# Patient Record
Sex: Male | Born: 2000
Health system: Southern US, Community
[De-identification: ages and names within clinical notes are randomized; demographics above are authoritative.]

## PROBLEM LIST (undated history)

## (undated) DIAGNOSIS — F988 Other specified behavioral and emotional disorders with onset usually occurring in childhood and adolescence: Secondary | ICD-10-CM

## (undated) HISTORY — PX: NO PAST SURGERIES: SHX2092

---
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
## 2019-04-22 ENCOUNTER — Ambulatory Visit
Admission: EM | Admit: 2019-04-22 | Discharge: 2019-04-22 | Disposition: A | Discharge status: Home / Self Care | Payer: BC Managed Care – PPO | Attending: Emergency Medicine | Admitting: Emergency Medicine

## 2019-04-22 DIAGNOSIS — J45990 Exercise induced bronchospasm: Secondary | ICD-10-CM

## 2019-04-22 HISTORY — DX: Other specified behavioral and emotional disorders with onset usually occurring in childhood and adolescence: F98.8

## 2019-04-22 MED ORDER — ALBUTEROL SULFATE HFA 108 (90 BASE) MCG/ACT IN AERS: 2.0000 | INHALATION_SPRAY | RESPIRATORY_TRACT | 0 refills | Status: DC | PRN

## 2019-04-22 NOTE — Discharge Instructions (Signed)
Use albuterol inhaler as discussed. Keep a symptom log: When you get short of breath, what makes it better, or urine at the time. Important to follow-up with primary care for further evaluation. Go to ER for severe shortness of breath (especially if it does not improve with albuterol inhaler), chest pain, lightheadedness, syncopal event (loss of consciousness).

## 2019-04-22 NOTE — ED Provider Notes (Signed)
[facility]    CSN: [id number] Arrival date & time: 04/22/19  0808      History   Chief Complaint Chief Complaint  Patient presents with  . Shortness of Breath    HPI Christopher Koch is a 18 y.o. male without significant medical history presenting for chronic intermittent history of shortness of breath, redness and numbness over dorsal aspect of left foot.  States the symptoms do not coincide.  Has not noticed what makes the foot symptoms better/worse.  Denies low back pain, urinary or fecal incontinence/change in bowel or bladder habit.  Denies injury, pain, warmth to his foot.  Patient voices concern that he knows that there can be "neurologic damage from diabetes ".  Patient denies personal history of diabetes, states his grandfather had diabetes. Patient states that difficulty breathing occurs typically when exerting himself, though will rarely have brief episode "when I used to work at [employer], I would be doing anything ".  Denies lightheadedness, syncopal event, chest pain, wheezing during these episodes.  States that it feels like "I just cannot get a full breath ".  Patient denies history of asthma, the states his sister has asthma and is concerned he may have that.  Patient does not currently have primary care. Overall, patient denies personal or family history of connective tissue disorder, sudden/early cardiac death, autoimmune disorders.   Past Medical History:  Diagnosis Date  . ADD (attention deficit disorder)     There are no active problems to display for this patient.   History reviewed. No pertinent surgical history.     Home Medications    Prior to Admission medications   Medication Sig Start Date End Date Taking? Authorizing Provider  albuterol (VENTOLIN HFA) 108 (90 Base) MCG/ACT inhaler Inhale 2 puffs into the lungs every 4 (four) hours as needed for wheezing or shortness of breath. 04/22/19   Hall-Potvin, [place], PA-C    Family History  History reviewed. No pertinent family history.  Social History Social History   Tobacco Use  . Smoking status: Never Smoker  . Smokeless tobacco: Never Used  Substance Use Topics  . Alcohol use: No    Alcohol/week: 0.0 standard drinks  . Drug use: No     Allergies   Patient has no known allergies.   Review of Systems Review of Systems  Constitutional: Negative for activity change, appetite change, fatigue and fever.  HENT: Negative for congestion, dental problem, ear pain, facial swelling, hearing loss, sinus pain, sore throat, tinnitus, trouble swallowing and voice change.   Eyes: Negative for photophobia, pain and visual disturbance.  Respiratory: Positive for shortness of breath. Negative for apnea, cough, choking, chest tightness, wheezing and stridor.   Cardiovascular: Negative for chest pain, palpitations and leg swelling.  Gastrointestinal: Negative for abdominal pain, diarrhea, nausea and vomiting.  Endocrine: Negative for cold intolerance, heat intolerance, polydipsia, polyphagia and polyuria.  Musculoskeletal: Negative for arthralgias, back pain, gait problem, myalgias and neck pain.  Skin: Negative for rash and wound.  Neurological: Negative for dizziness, tremors, seizures, syncope, facial asymmetry, speech difficulty, weakness, light-headedness, numbness and headaches.  Hematological: Does not bruise/bleed easily.  Psychiatric/Behavioral: Negative for confusion, decreased concentration, self-injury, sleep disturbance and suicidal ideas. The patient is not nervous/anxious and is not hyperactive.   All other systems reviewed and are negative.    Physical Exam Triage Vital Signs ED Triage Vitals  Enc Vitals Group     BP      Pulse  Resp      Temp      Temp src      SpO2      Weight      Height      Head Circumference      Peak Flow      Pain Score      Pain Loc      Pain Edu?      Excl. in GC?    No data found.  Updated Vital Signs BP 117/63  (BP Location: Left Arm)   Pulse 96   Temp 98.3 F (36.8 C) (Oral)   Resp 18   SpO2 97%   Visual Acuity Right Eye Distance:   Left Eye Distance:   Bilateral Distance:    Right Eye Near:   Left Eye Near:    Bilateral Near:     Physical Exam Constitutional:      General: He is not in acute distress.    Appearance: He is well-developed and normal weight. He is not ill-appearing.  HENT:     Head: Normocephalic and atraumatic.     Mouth/Throat:     Mouth: Mucous membranes are moist.     Pharynx: Oropharynx is clear. No pharyngeal swelling or oropharyngeal exudate.  Eyes:     General: No scleral icterus.    Pupils: Pupils are equal, round, and reactive to light.  Neck:     Musculoskeletal: Neck supple.     Thyroid: No thyromegaly.     Vascular: No hepatojugular reflux or JVD.     Trachea: No tracheal deviation.  Cardiovascular:     Rate and Rhythm: Normal rate and regular rhythm.  No extrasystoles are present.    Pulses: No decreased pulses.     Heart sounds: No murmur. No friction rub. No gallop.      Comments: Heart sounds do not change with Valsalva.  PMI is nondisplaced.   Pulmonary:     Effort: Pulmonary effort is normal. No accessory muscle usage or respiratory distress.     Breath sounds: No decreased breath sounds, wheezing, rhonchi or rales.  Chest:     Chest wall: No deformity, tenderness, crepitus or edema.  Abdominal:     General: Bowel sounds are normal.     Palpations: Abdomen is soft. There is no hepatomegaly or splenomegaly.     Tenderness: There is no abdominal tenderness. There is no guarding.  Musculoskeletal: Normal range of motion.     Right lower leg: He exhibits no tenderness. No edema.     Left lower leg: He exhibits no tenderness. No edema.  Lymphadenopathy:     Cervical: No cervical adenopathy.  Skin:    General: Skin is warm.     Capillary Refill: Capillary refill takes less than 2 seconds.     Coloration: Skin is not cyanotic, jaundiced or  pale.     Comments: Feet are without lesions, dermatitis, pain, warmth bilaterally.  NVI  Neurological:     General: No focal deficit present.     Mental Status: He is alert and oriented to person, place, and time.     Comments: Upper and lower DTRs 2+ bilaterally and symmetric  Psychiatric:        Mood and Affect: Mood normal.        Behavior: Behavior normal.      UC Treatments / Results  Labs (all labs ordered are listed, but only abnormal results are displayed) Labs Reviewed - No data to display  EKG  Radiology No results found.  Procedures Procedures (including critical care time)  Medications Ordered in UC Medications - No data to display  Initial Impression / Assessment and Plan / UC Course  I have reviewed the triage vital signs and the nursing notes.  Pertinent labs & imaging results that were available during my care of the patient were reviewed by me and considered in my medical decision making (see chart for details).     1.  Exercise-induced asthma History most consistent with exercise-induced asthma.  Given sibling with asthma, reassuring physical exam today will trial albuterol inhaler for these episodes.  Reviewed proper MDI administration, provided additional patient information in visit summary.  Stressed importance of following up with primary care for further evaluation.  Return precautions discussed, patient verbalized understanding and is agreeable to plan. Final Clinical Impressions(s) / UC Diagnoses   Final diagnoses:  Exercise-induced asthma     Discharge Instructions     Use albuterol inhaler as discussed. Keep a symptom log: When you get short of breath, what makes it better, or urine at the time. Important to follow-up with primary care for further evaluation. Go to ER for severe shortness of breath (especially if it does not improve with albuterol inhaler), chest pain, lightheadedness, syncopal event (loss of consciousness).    ED  Prescriptions    Medication Sig Dispense Auth. Provider   albuterol (VENTOLIN HFA) 108 (90 Base) MCG/ACT inhaler Inhale 2 puffs into the lungs every 4 (four) hours as needed for wheezing or shortness of breath. 18 g Hall-Potvin, [place], PA-C     Controlled Substance Prescriptions [place] Controlled Substance Registry consulted? Not Applicable   Shea Evans, [place] 04/22/19 [phone]

## 2019-04-22 NOTE — ED Triage Notes (Signed)
Pt c/o SOB after working out or any exertion. Pt c/o redness and numbness to lt foot. Pt speaking in complete sentences. No distress.

## 2019-05-08 ENCOUNTER — Ambulatory Visit
Admission: EM | Admit: 2019-05-08 | Discharge: 2019-05-08 | Disposition: A | Discharge status: Home / Self Care | Payer: BC Managed Care – PPO

## 2019-05-08 DIAGNOSIS — R0609 Other forms of dyspnea: Secondary | ICD-10-CM

## 2019-05-08 NOTE — Discharge Instructions (Signed)
Try albuterol with aerochamber. Follow up with PCP as scheduled for further evaluation needed.

## 2019-05-08 NOTE — ED Provider Notes (Signed)
[facility]    CSN: [id number] Arrival date & time: 05/08/19  1404      History   Chief Complaint Chief Complaint  Patient presents with  . Shortness of Breath    HPI Christopher Koch is a 18 y.o. male.   18 year old male comes in requesting xray and blood work. States he feels short of breath with exercising for the past 11 months. He is currently asymptomatic. He was seen 04/22/2019 for similar symptoms, was provided an albuterol inhaler. States he used it once prior to exercising without relief. However, he read online that albuterol can cause seizure, so he discontinued. He denies any worsening of symptoms. Denies personal or family history of heart disease. Denies exertional chest pain, dizziness, weakness, syncope.    HPI from 04/22/2019 Christopher Koch is a 18 y.o. male without significant medical history presenting for chronic intermittent history of shortness of breath, redness and numbness over dorsal aspect of left foot.  States the symptoms do not coincide.  Has not noticed what makes the foot symptoms better/worse.  Denies low back pain, urinary or fecal incontinence/change in bowel or bladder habit.  Denies injury, pain, warmth to his foot.  Patient voices concern that he knows that there can be "neurologic damage from diabetes ".  Patient denies personal history of diabetes, states his grandfather had diabetes. Patient states that difficulty breathing occurs typically when exerting himself, though will rarely have brief episode "when I used to work at [employer], I would be doing anything ".  Denies lightheadedness, syncopal event, chest pain, wheezing during these episodes.  States that it feels like "I just cannot get a full breath ".  Patient denies history of asthma, the states his sister has asthma and is concerned he may have that.  Patient does not currently have primary care. Overall, patient denies personal or family history of connective tissue disorder,  sudden/early cardiac death, autoimmune disorders.     Past Medical History:  Diagnosis Date  . ADD (attention deficit disorder)     There are no active problems to display for this patient.   History reviewed. No pertinent surgical history.     Home Medications    Prior to Admission medications   Medication Sig Start Date End Date Taking? Authorizing Provider  albuterol (VENTOLIN HFA) 108 (90 Base) MCG/ACT inhaler Inhale 2 puffs into the lungs every 4 (four) hours as needed for wheezing or shortness of breath. 04/22/19   Hall-Potvin, [place], PA-C    Family History History reviewed. No pertinent family history.  Social History Social History   Tobacco Use  . Smoking status: Never Smoker  . Smokeless tobacco: Never Used  Substance Use Topics  . Alcohol use: No    Alcohol/week: 0.0 standard drinks  . Drug use: No     Allergies   Patient has no known allergies.   Review of Systems Review of Systems  Reason unable to perform ROS: See HPI as above.     Physical Exam Triage Vital Signs ED Triage Vitals [05/08/19 1413]  Enc Vitals Group     BP 118/78     Pulse Rate 78     Resp 16     Temp 97.6 F (36.4 C)     Temp Source Oral     SpO2 98 %     Weight      Height      Head Circumference      Peak Flow  Pain Score      Pain Loc      Pain Edu?      Excl. in GC?    No data found.  Updated Vital Signs BP 118/78 (BP Location: Left Arm)   Pulse 78   Temp 97.6 F (36.4 C) (Oral)   Resp 16   SpO2 98%   Physical Exam Constitutional:      General: He is not in acute distress.    Appearance: Normal appearance. He is not ill-appearing, toxic-appearing or diaphoretic.  HENT:     Head: Normocephalic and atraumatic.     Right Ear: Tympanic membrane, ear canal and external ear normal. Tympanic membrane is not erythematous or bulging.     Left Ear: Tympanic membrane, ear canal and external ear normal. Tympanic membrane is not erythematous or bulging.      Nose: Nose normal.     Right Sinus: No maxillary sinus tenderness or frontal sinus tenderness.     Left Sinus: No maxillary sinus tenderness or frontal sinus tenderness.     Mouth/Throat:     Mouth: Mucous membranes are moist.     Pharynx: Oropharynx is clear. Uvula midline.  Eyes:     Conjunctiva/sclera: Conjunctivae normal.     Pupils: Pupils are equal, round, and reactive to light.  Neck:     Musculoskeletal: Normal range of motion and neck supple.  Cardiovascular:     Rate and Rhythm: Normal rate and regular rhythm.     Heart sounds: Normal heart sounds. No murmur. No friction rub. No gallop.   Pulmonary:     Effort: Pulmonary effort is normal. No accessory muscle usage, respiratory distress or retractions.     Breath sounds: Normal breath sounds. No stridor, decreased air movement or transmitted upper airway sounds. No decreased breath sounds, wheezing, rhonchi or rales.  Skin:    General: Skin is warm and dry.  Neurological:     Mental Status: He is alert and oriented to person, place, and time.  Psychiatric:        Behavior: Behavior normal.        Judgment: Judgment normal.      UC Treatments / Results  Labs (all labs ordered are listed, but only abnormal results are displayed) Labs Reviewed - No data to display  EKG   Radiology No results found.  Procedures Procedures (including critical care time)  Medications Ordered in UC Medications - No data to display  Initial Impression / Assessment and Plan / UC Course  I have reviewed the triage vital signs and the nursing notes.  Pertinent labs & imaging results that were available during my care of the patient were reviewed by me and considered in my medical decision making (see chart for details).     Patient currently asymptomatic. LCTAB, speaking in full sentences. RRR, no murmurs heard. Discussed will need evaluation with PCP. Will provide aerochamber for albuterol to ensure adequate dose of albuterol.  Reassurance provided on albuterol use. PCP appointment [facility] at [facility] on 05/22/2019, patient to follow up for further evaluation and management needed. Return precautions given. Patient expresses understanding and agrees to plan.  Final Clinical Impressions(s) / UC Diagnoses   Final diagnoses:  Dyspnea on exertion   ED Prescriptions    None     PDMP not reviewed this encounter.   Belinda Fisher, PA-C 05/08/19 1441

## 2019-05-08 NOTE — ED Triage Notes (Signed)
Pt requesting x rays and blood work. States he gets SOB when he is active and his feet turns red for the past 11 months. Pt denies any sx's at this time.

## 2019-05-21 ENCOUNTER — Telehealth: Payer: Self-pay

## 2019-05-21 NOTE — Telephone Encounter (Signed)
Called patient to do their pre-visit COVID screening.  Attempted to call patient twice. Phone was answered and then hung up. Nobody said "hello" or responded to me saying "hello".

## 2019-05-21 NOTE — Progress Notes (Addendum)
Subjective:    Patient ID: Christopher Koch, male    DOB: 04-09-2001, 18 y.o.   MRN: [id number]  18 y.o.:M  Here to establish dyspnea on exertion Patient has a 1 year history of exertional dyspnea.  The patient denies specific wheezing or cough.  Shortness of breath appears to be worse.  Also the patient has complaints of the redness and numbness over the dorsal aspect of the left foot and occasionally the right foot as well.  This appears to be a separate issue.  The patient does have postnasal drainage.  Patient has no prior history of diabetes or other medical conditions.  The patient denies lightheadedness or passing out or specific chest pain.  He just states he cannot get a complete full breath.  He denies prior history of asthma.  The patient is here to establish for primary care.  The patient finished through his senior year of high school but did not graduate.  He was going to go to college but did not follow through with this.  He does play basketball every day but has difficulty completing the basketball workout.  He does note significant postnasal drainage and also reflux symptoms particularly eating certain foods such as pizza.  Please refer to asthma assessment below.   Asthma He complains of chest tightness and shortness of breath. There is no cough, difficulty breathing, frequent throat clearing, hemoptysis, hoarse voice, sputum production or wheezing. This is a chronic problem. The current episode started more than 1 year ago. Associated symptoms include chest pain, dyspnea on exertion, ear congestion, ear pain, headaches, heartburn, malaise/fatigue, nasal congestion, postnasal drip and rhinorrhea. Pertinent negatives include no orthopnea, PND, sneezing, sore throat, sweats, trouble swallowing or weight loss. His symptoms are aggravated by climbing stairs, exposure to fumes, exercise, any activity, change in weather, minimal activity and URI. His symptoms are alleviated by beta-agonist.  There is no history of asthma.    Past Medical History:  Diagnosis Date  . ADD (attention deficit disorder)      Family History  Problem Relation Age of Onset  . Heart disease Maternal Grandmother   . Diabetes Maternal Grandfather      Social History   Socioeconomic History  . Marital status: Single    Spouse name: Not on file  . Number of children: Not on file  . Years of education: Not on file  . Highest education level: Not on file  Occupational History  . Not on file  Social Needs  . Financial resource strain: Not on file  . Food insecurity    Worry: Not on file    Inability: Not on file  . Transportation needs    Medical: Not on file    Non-medical: Not on file  Tobacco Use  . Smoking status: Never Smoker  . Smokeless tobacco: Never Used  Substance and Sexual Activity  . Alcohol use: Never    Alcohol/week: 0.0 standard drinks    Frequency: Never  . Drug use: Never  . Sexual activity: Never  Lifestyle  . Physical activity    Days per week: Not on file    Minutes per session: Not on file  . Stress: Not on file  Relationships  . Social Musician on phone: Not on file    Gets together: Not on file    Attends religious service: Not on file    Active member of club or organization: Not on file    Attends meetings of  clubs or organizations: Not on file    Relationship status: Not on file  . Intimate partner violence    Fear of current or ex partner: Not on file    Emotionally abused: Not on file    Physically abused: Not on file    Forced sexual activity: Not on file  Other Topics Concern  . Not on file  Social History Narrative  . Not on file     No Known Allergies   Outpatient Medications Prior to Visit  Medication Sig Dispense Refill  . albuterol (VENTOLIN HFA) 108 (90 Base) MCG/ACT inhaler Inhale 2 puffs into the lungs every 4 (four) hours as needed for wheezing or shortness of breath. 18 g 0   No facility-administered medications  prior to visit.      Review of Systems  Constitutional: Positive for malaise/fatigue. Negative for weight loss.  HENT: Positive for ear pain, postnasal drip and rhinorrhea. Negative for hoarse voice, sneezing, sore throat and trouble swallowing.   Respiratory: Positive for shortness of breath. Negative for cough, hemoptysis, sputum production and wheezing.   Cardiovascular: Positive for chest pain and dyspnea on exertion. Negative for PND.  Gastrointestinal: Positive for heartburn.  Neurological: Positive for headaches.       Objective:   Physical Exam Vitals:   05/22/19 1434  BP: 107/65  Pulse: (!) 115  Resp: 17  Temp: 97.7 F (36.5 C)  TempSrc: Temporal  SpO2: 95%  Weight: 159 lb (72.1 kg)  Height: 6\' 1"  (1.854 m)    Gen: Pleasant, well-nourished, in no distress,  normal affect  ENT: No lesions,  mouth clear,  oropharynx clear, no postnasal drip R ear cerumen impaction, some cerumen in left ear   Neck: No JVD, no TMG, no carotid bruits  Lungs: No use of accessory muscles, no dullness to percussion, clear without rales or rhonchi  Cardiovascular: RRR, heart sounds normal, no murmur or gallops, no peripheral edema  Abdomen: soft and NT, no HSM,  BS normal  Musculoskeletal: No deformities, no cyanosis or clubbing Redness over the dorsum of the left foot and a focalized area nontender appears to be an area where the shoe is not properly fitting to the dorsum of the foot  Neuro: alert, non focal  Skin: Warm, no lesions or rashes  No results found.        Assessment & Plan:  I personally reviewed all images and lab data in the [facility] system as well as any outside material available during this office visit and agree with the  radiology impressions.   Exercise-induced asthma Symptom complex most consistent with exercise-induced asthma and associated allergic rhinitis  Plan for the patient will be to begin Singulair 10 mg daily as needed albuterol will continue  patient will also receive Flonase 2 sprays each nostril daily and Claritin 10 mg daily  We will see the patient back in follow-up in 1 month  GERD (gastroesophageal reflux disease) The patient has very mild reflux disease therefore I gave the patient a diet plan and will hold off prescribing specific medications  Foot pain, bilateral Chronic bilateral foot pain with redness over the dorsum of the left foot appears to be an area where he is likely not fitting his basketball exercise shoes properly  Nevertheless will refer to podiatry for further evaluation  Cerumen impaction R cerumen impaction with some cerumen in left ear as well  RN cleaned both ears without complication   Jullien was seen today for establish care and  asthma.  Diagnoses and all orders for this visit:  Exercise-induced asthma  Dyspnea on exertion -     DG Chest 2 View  Foot pain, bilateral -     Ambulatory referral to Podiatry  Non-seasonal allergic rhinitis, unspecified trigger  Gastroesophageal reflux disease without esophagitis  Impacted cerumen of right ear  Other orders -     montelukast (SINGULAIR) 10 MG tablet; Take 1 tablet (10 mg total) by mouth at bedtime. -     fluticasone (FLONASE) 50 MCG/ACT nasal spray; Place 2 sprays into both nostrils daily. -     cetirizine (ZYRTEC) 10 MG tablet; Take 1 tablet (10 mg total) by mouth daily.

## 2019-05-22 ENCOUNTER — Encounter: Payer: Self-pay | Admitting: Critical Care Medicine

## 2019-05-22 ENCOUNTER — Other Ambulatory Visit: Payer: Self-pay

## 2019-05-22 ENCOUNTER — Ambulatory Visit (INDEPENDENT_AMBULATORY_CARE_PROVIDER_SITE_OTHER): Payer: BC Managed Care – PPO

## 2019-05-22 ENCOUNTER — Ambulatory Visit (INDEPENDENT_AMBULATORY_CARE_PROVIDER_SITE_OTHER): Payer: BC Managed Care – PPO | Admitting: Critical Care Medicine

## 2019-05-22 VITALS — BP 107/65 | HR 115 | Temp 97.7°F | Resp 17 | Ht 73.0 in | Wt 159.0 lb

## 2019-05-22 DIAGNOSIS — R0602 Shortness of breath: Secondary | ICD-10-CM

## 2019-05-22 DIAGNOSIS — J45990 Exercise induced bronchospasm: Secondary | ICD-10-CM

## 2019-05-22 DIAGNOSIS — H6121 Impacted cerumen, right ear: Secondary | ICD-10-CM

## 2019-05-22 DIAGNOSIS — J3089 Other allergic rhinitis: Secondary | ICD-10-CM

## 2019-05-22 DIAGNOSIS — R0609 Other forms of dyspnea: Secondary | ICD-10-CM | POA: Diagnosis not present

## 2019-05-22 DIAGNOSIS — H6123 Impacted cerumen, bilateral: Secondary | ICD-10-CM | POA: Diagnosis not present

## 2019-05-22 DIAGNOSIS — M79672 Pain in left foot: Secondary | ICD-10-CM

## 2019-05-22 DIAGNOSIS — J309 Allergic rhinitis, unspecified: Secondary | ICD-10-CM | POA: Insufficient documentation

## 2019-05-22 DIAGNOSIS — M79671 Pain in right foot: Secondary | ICD-10-CM | POA: Insufficient documentation

## 2019-05-22 DIAGNOSIS — K219 Gastro-esophageal reflux disease without esophagitis: Secondary | ICD-10-CM

## 2019-05-22 MED ORDER — CETIRIZINE HCL 10 MG PO TABS: 10.0000 mg | ORAL_TABLET | Freq: Every day | ORAL | 11 refills | Status: DC

## 2019-05-22 MED ORDER — MONTELUKAST SODIUM 10 MG PO TABS: 10.0000 mg | ORAL_TABLET | Freq: Every day | ORAL | 3 refills | Status: DC

## 2019-05-22 MED ORDER — FLUTICASONE PROPIONATE 50 MCG/ACT NA SUSP: 2.0000 | Freq: Every day | NASAL | 6 refills | Status: DC

## 2019-05-22 NOTE — Assessment & Plan Note (Signed)
Chronic bilateral foot pain with redness over the dorsum of the left foot appears to be an area where he is likely not fitting his basketball exercise shoes properly  Nevertheless will refer to podiatry for further evaluation

## 2019-05-22 NOTE — Assessment & Plan Note (Signed)
Symptom complex most consistent with exercise-induced asthma and associated allergic rhinitis  Plan for the patient will be to begin Singulair 10 mg daily as needed albuterol will continue patient will also receive Flonase 2 sprays each nostril daily and Claritin 10 mg daily  We will see the patient back in follow-up in 1 month

## 2019-05-22 NOTE — Assessment & Plan Note (Signed)
The patient has very mild reflux disease therefore I gave the patient a diet plan and will hold off prescribing specific medications

## 2019-05-22 NOTE — Patient Instructions (Signed)
Start Singulair take 1 at bedtime  Start Flonase 2 sprays each nostril daily and loratadine 10 mg daily  Use your albuterol inhaler 2 inhalations before any exercise that she would plan to do otherwise take it as needed every 6 hours for shortness of breath  Follow a reflux diet as outlined below  Return to see Dr. Joya Gaskins 1 month   Food Choices for Gastroesophageal Reflux Disease, Adult When you have gastroesophageal reflux disease (GERD), the foods you eat and your eating habits are very important. Choosing the right foods can help ease your discomfort. Think about working with a nutrition specialist (dietitian) to help you make good choices. What are tips for following this plan?  Meals  Choose healthy foods that are low in fat, such as fruits, vegetables, whole grains, low-fat dairy products, and lean meat, fish, and poultry.  Eat small meals often instead of 3 large meals a day. Eat your meals slowly, and in a place where you are relaxed. Avoid bending over or lying down until 2-3 hours after eating.  Avoid eating meals 2-3 hours before bed.  Avoid drinking a lot of liquid with meals.  Cook foods using methods other than frying. Bake, grill, or broil food instead.  Avoid or limit: ? Chocolate. ? Peppermint or spearmint. ? Alcohol. ? Pepper. ? Black and decaffeinated coffee. ? Black and decaffeinated tea. ? Bubbly (carbonated) soft drinks. ? Caffeinated energy drinks and soft drinks.  Limit high-fat foods such as: ? Fatty meat or fried foods. ? Whole milk, cream, butter, or ice cream. ? Nuts and nut butters. ? Pastries, donuts, and sweets made with butter or shortening.  Avoid foods that cause symptoms. These foods may be different for everyone. Common foods that cause symptoms include: ? Tomatoes. ? Oranges, lemons, and limes. ? Peppers. ? Spicy food. ? Onions and garlic. ? Vinegar. Lifestyle  Maintain a healthy weight. Ask your doctor what weight is healthy  for you. If you need to lose weight, work with your doctor to do so safely.  Exercise for at least 30 minutes for 5 or more days each week, or as told by your doctor.  Wear loose-fitting clothes.  Do not smoke. If you need help quitting, ask your doctor.  Sleep with the head of your bed higher than your feet. Use a wedge under the mattress or blocks under the bed frame to raise the head of the bed. Summary  When you have gastroesophageal reflux disease (GERD), food and lifestyle choices are very important in easing your symptoms.  Eat small meals often instead of 3 large meals a day. Eat your meals slowly, and in a place where you are relaxed.  Limit high-fat foods such as fatty meat or fried foods.  Avoid bending over or lying down until 2-3 hours after eating.  Avoid peppermint and spearmint, caffeine, alcohol, and chocolate. This information is not intended to replace advice given to you by your health care provider. Make sure you discuss any questions you have with your health care provider. Document Released: 01/30/2012 Document Revised: 11/21/2018 Document Reviewed: 09/05/2016 Elsevier Patient Education  2020 [employer].

## 2019-05-25 DIAGNOSIS — H6120 Impacted cerumen, unspecified ear: Secondary | ICD-10-CM | POA: Insufficient documentation

## 2019-05-25 NOTE — Assessment & Plan Note (Signed)
R cerumen impaction with some cerumen in left ear as well  RN cleaned both ears without complication

## 2019-05-26 ENCOUNTER — Encounter: Payer: BLUE CROSS/BLUE SHIELD | Admitting: Podiatry

## 2019-06-26 ENCOUNTER — Ambulatory Visit: Payer: BC Managed Care – PPO

## 2019-09-12 ENCOUNTER — Ambulatory Visit
Admission: EM | Admit: 2019-09-12 | Discharge: 2019-09-12 | Disposition: A | Discharge status: Home / Self Care | Payer: Commercial Managed Care - PPO | Attending: Emergency Medicine | Admitting: Emergency Medicine

## 2019-09-12 DIAGNOSIS — Z20822 Contact with and (suspected) exposure to covid-19: Secondary | ICD-10-CM | POA: Diagnosis not present

## 2019-09-12 DIAGNOSIS — J029 Acute pharyngitis, unspecified: Secondary | ICD-10-CM | POA: Diagnosis present

## 2019-09-12 LAB — POCT RAPID STREP A (OFFICE): Rapid Strep A Screen: NEGATIVE

## 2019-09-12 NOTE — Discharge Instructions (Addendum)
Your rapid strep test was negative today.  The culture is pending.  Please look on your MyChart for test results.   We will notify you if the culture positive and outline a treatment plan at that time.   Please continue Tylenol and/or Ibuprofen as needed for fever, pain.  May try warm salt water gargles, cepacol lozenges, throat spray, warm tea or water with lemon/honey, or OTC cold relief medicine for throat discomfort.  May gargle, spit viscous lidocaine as prescribed for additional relief.  (Please note this may cause the back of your tongue and mouth to be numb as well)  For congestion: take a daily anti-histamine like Zyrtec, Claritin, and a oral decongestant to help with post nasal drip that may be irritating your throat.   It is important to stay hydrated: drink plenty of fluids (primarily water) to keep your throat moisturized and help further relieve irritation/discomfort.  

## 2019-09-12 NOTE — ED Provider Notes (Signed)
[facility]    CSN: [id number] Arrival date & time: 09/12/19  1151      History   Chief Complaint Chief Complaint  Patient presents with  . Sore Throat    HPI Christopher Koch is a 19 y.o. male present for sore throat, congestion, fatigue and headache for the last week.  Headache is generalized without alarm symptoms such as change in vision, worst headache of life, thunderclap headache, tinnitus, fever.  Denies difficulty breathing or swallowing, sinus pain or pressure, dental pain, cough, shortness of breath.  No known Covid exposure.  Has not take anything for his symptoms.   Past Medical History:  Diagnosis Date  . ADD (attention deficit disorder)     Patient Active Problem List   Diagnosis Date Noted  . Cerumen impaction 05/25/2019  . Exercise-induced asthma 05/22/2019  . Foot pain, bilateral 05/22/2019  . Allergic rhinitis 05/22/2019  . GERD (gastroesophageal reflux disease) 05/22/2019    Past Surgical History:  Procedure Laterality Date  . NO PAST SURGERIES         Home Medications    Prior to Admission medications   Not on File    Family History Family History  Problem Relation Age of Onset  . Heart disease Maternal Grandmother   . Diabetes Maternal Grandfather     Social History Social History   Tobacco Use  . Smoking status: Never Smoker  . Smokeless tobacco: Never Used  Substance Use Topics  . Alcohol use: Never    Alcohol/week: 0.0 standard drinks  . Drug use: Never     Allergies   Patient has no known allergies.   Review of Systems As per HPI   Physical Exam Triage Vital Signs ED Triage Vitals  Enc Vitals Group     BP 09/12/19 1203 106/74     Pulse Rate 09/12/19 1203 67     Resp 09/12/19 1203 16     Temp 09/12/19 1203 (!) 97.5 F (36.4 C)     Temp Source 09/12/19 1203 Oral     SpO2 09/12/19 1203 99 %     Weight --      Height --      Head Circumference --      Peak Flow --      Pain Score 09/12/19  1205 6     Pain Loc --      Pain Edu? --      Excl. in [place]? --    No data found.  Updated Vital Signs BP 106/74 (BP Location: Left Arm)   Pulse 67   Temp (!) 97.5 F (36.4 C) (Oral)   Resp 16   SpO2 99%   Visual Acuity Right Eye Distance:   Left Eye Distance:   Bilateral Distance:    Right Eye Near:   Left Eye Near:    Bilateral Near:     Physical Exam Constitutional:      General: He is not in acute distress. HENT:     Head: Normocephalic and atraumatic.     Right Ear: Tympanic membrane, ear canal and external ear normal.     Left Ear: Tympanic membrane, ear canal and external ear normal.     Nose: No nasal deformity, congestion or rhinorrhea.     Comments: Turbinates nonedematous bilaterally with pink mucosa    Mouth/Throat:     Mouth: Mucous membranes are moist.     Tongue: Tongue does not deviate from midline.     Pharynx: Oropharynx  is clear. Uvula midline.     Comments: No tonsillar hypertrophy or exudate Eyes:     General: No scleral icterus.    Conjunctiva/sclera: Conjunctivae normal.     Pupils: Pupils are equal, round, and reactive to light.  Cardiovascular:     Rate and Rhythm: Normal rate.  Pulmonary:     Effort: Pulmonary effort is normal. No respiratory distress.     Breath sounds: No wheezing.  Musculoskeletal:     Cervical back: Normal range of motion and neck supple. No muscular tenderness.  Lymphadenopathy:     Cervical: No cervical adenopathy.  Neurological:     Mental Status: He is alert.      UC Treatments / Results  Labs (all labs ordered are listed, but only abnormal results are displayed) Labs Reviewed  POCT RAPID STREP A (OFFICE) - Normal  NOVEL CORONAVIRUS, NAA  CULTURE, GROUP A STREP [facility])    EKG   Radiology No results found.  Procedures Procedures (including critical care time)  Medications Ordered in UC Medications - No data to display  Initial Impression / Assessment and Plan / UC Course  I have reviewed the  triage vital signs and the nursing notes.  Pertinent labs & imaging results that were available during my care of the patient were reviewed by me and considered in my medical decision making (see chart for details).     Patient afebrile, nontoxic, with SpO2 99%.  Rapid strep negative-culture pending.  Covid PCR pending.  Patient to quarantine until results are back.  We will continue supportive management.  Return precautions discussed, patient verbalized understanding and is agreeable to plan. Final Clinical Impressions(s) / UC Diagnoses   Final diagnoses:  Sore throat     Discharge Instructions     Your rapid strep test was negative today.  The culture is pending.  Please look on your MyChart for test results.   We will notify you if the culture positive and outline a treatment plan at that time.   Please continue Tylenol and/or Ibuprofen as needed for fever, pain.  May try warm salt water gargles, cepacol lozenges, throat spray, warm tea or water with lemon/honey, or OTC cold relief medicine for throat discomfort.  May gargle, spit viscous lidocaine as prescribed for additional relief.  (Please note this may cause the back of your tongue and mouth to be numb as well)  For congestion: take a daily anti-histamine like Zyrtec, Claritin, and a oral decongestant to help with post nasal drip that may be irritating your throat.   It is important to stay hydrated: drink plenty of fluids (primarily water) to keep your throat moisturized and help further relieve irritation/discomfort.     ED Prescriptions    None     PDMP not reviewed this encounter.   Hall-Potvin, [place], [place] 09/12/19 1535

## 2019-09-12 NOTE — ED Triage Notes (Signed)
Pt c/o sore throat, congestion, fatigue and headache x1wk

## 2019-09-13 LAB — NOVEL CORONAVIRUS, NAA: SARS-CoV-2, NAA: NOT DETECTED

## 2019-09-14 LAB — CULTURE, GROUP A STREP (THRC)

## 2020-02-19 ENCOUNTER — Ambulatory Visit
Admission: EM | Admit: 2020-02-19 | Discharge: 2020-02-19 | Disposition: A | Discharge status: Home / Self Care | Payer: Commercial Managed Care - PPO | Attending: Emergency Medicine | Admitting: Emergency Medicine

## 2020-02-19 DIAGNOSIS — M25511 Pain in right shoulder: Secondary | ICD-10-CM

## 2020-02-19 DIAGNOSIS — M7918 Myalgia, other site: Secondary | ICD-10-CM

## 2020-02-19 MED ORDER — CYCLOBENZAPRINE HCL 5 MG PO TABS: 5.0000 mg | ORAL_TABLET | Freq: Every evening | ORAL | 0 refills | Status: DC | PRN

## 2020-02-19 MED ORDER — NAPROXEN 500 MG PO TABS: 500.0000 mg | ORAL_TABLET | Freq: Two times a day (BID) | ORAL | 0 refills | Status: DC

## 2020-02-19 NOTE — Discharge Instructions (Signed)
Recommend RICE: rest, ice, compression, elevation as needed for pain.    Heat therapy (hot compress, warm wash rag, hot showers, etc.) can help relax muscles and soothe muscle aches. Cold therapy (ice packs) can be used to help swelling both after injury and after prolonged use of areas of chronic pain/aches.  For pain: recommend 350 mg-1000 mg of Tylenol (acetaminophen) and/or 200 mg - 800 mg of Advil (ibuprofen, Motrin) every 8 hours as needed.  May alternate between the two throughout the day as they are generally safe to take together.  DO NOT exceed more than 3000 mg of Tylenol or 3200 mg of ibuprofen in a 24 hour period as this could damage your stomach, kidneys, liver, or increase your bleeding risk.  May take muscle relaxer as needed for severe pain / spasm.  (This medication may cause you to become tired so it is important you do not drink alcohol or operate heavy machinery while on this medication.  Recommend your first dose to be taken before bedtime to monitor for side effects safely)  Important to follow up with sports medicine.

## 2020-02-19 NOTE — ED Triage Notes (Signed)
Pt c/o rt upper back/shoulder pain for over a few years. States he works out every day and always have pain.

## 2020-02-19 NOTE — ED Provider Notes (Signed)
[facility]    CSN: [id number] Arrival date & time: 02/19/20  1111      History   Chief Complaint Chief Complaint  Patient presents with  . Back Pain    HPI Christopher Koch is a 19 y.o. male with history of ADD presenting for right upper back and shoulder pain.  States this has been intermittent for the last few years, though has been worse for the last few days.  No neck pain, upper extremity numbness or weakness, cough, chest pain or difficulty breathing.  No rash.  Has tried ibuprofen with some relief.  Denies trauma, fall.  States he missed work due to this.  Denies heavy lifting there.  Past Medical History:  Diagnosis Date  . ADD (attention deficit disorder)     Patient Active Problem List   Diagnosis Date Noted  . Cerumen impaction 05/25/2019  . Exercise-induced asthma 05/22/2019  . Foot pain, bilateral 05/22/2019  . Allergic rhinitis 05/22/2019  . GERD (gastroesophageal reflux disease) 05/22/2019    Past Surgical History:  Procedure Laterality Date  . NO PAST SURGERIES         Home Medications    Prior to Admission medications   Medication Sig Start Date End Date Taking? Authorizing Provider  cyclobenzaprine (FLEXERIL) 5 MG tablet Take 1 tablet (5 mg total) by mouth at bedtime as needed for muscle spasms. 02/19/20   Hall-Potvin, [place], PA-C  naproxen (NAPROSYN) 500 MG tablet Take 1 tablet (500 mg total) by mouth 2 (two) times daily. 02/19/20   Hall-Potvin, [place], PA-C    Family History Family History  Problem Relation Age of Onset  . Heart disease Maternal Grandmother   . Diabetes Maternal Grandfather     Social History Social History   Tobacco Use  . Smoking status: Never Smoker  . Smokeless tobacco: Never Used  Substance Use Topics  . Alcohol use: Never    Alcohol/week: 0.0 standard drinks  . Drug use: Never     Allergies   Patient has no known allergies.   Review of Systems As per HPI   Physical Exam Triage Vital  Signs ED Triage Vitals  Enc Vitals Group     BP      Pulse      Resp      Temp      Temp src      SpO2      Weight      Height      Head Circumference      Peak Flow      Pain Score      Pain Loc      Pain Edu?      Excl. in [place]?    No data found.  Updated Vital Signs BP (!) 108/57 (BP Location: Left Arm)   Pulse (!) 56   Temp 97.6 F (36.4 C) (Oral)   Resp 16   SpO2 98%   Visual Acuity Right Eye Distance:   Left Eye Distance:   Bilateral Distance:    Right Eye Near:   Left Eye Near:    Bilateral Near:     Physical Exam Constitutional:      General: He is not in acute distress. HENT:     Head: Normocephalic and atraumatic.  Eyes:     General: No scleral icterus.    Pupils: Pupils are equal, round, and reactive to light.  Cardiovascular:     Rate and Rhythm: Normal rate.  Pulmonary:  Effort: Pulmonary effort is normal. No respiratory distress.     Breath sounds: No wheezing.  Musculoskeletal:        General: Swelling and tenderness present. No deformity. Normal range of motion.     Comments: Right rhomboid edematous, tender.  No crepitus.  No bony deformity or tenderness of scapula, shoulder joint.  NVI  Skin:    Coloration: Skin is not jaundiced or pale.  Neurological:     Mental Status: He is alert and oriented to person, place, and time.      UC Treatments / Results  Labs (all labs ordered are listed, but only abnormal results are displayed) Labs Reviewed - No data to display  EKG   Radiology No results found.  Procedures Procedures (including critical care time)  Medications Ordered in UC Medications - No data to display  Initial Impression / Assessment and Plan / UC Course  I have reviewed the triage vital signs and the nursing notes.  Pertinent labs & imaging results that were available during my care of the patient were reviewed by me and considered in my medical decision making (see chart for details).     No injury or bony  deformity/tenderness: Radiography deferred.  Upper extremities neurovascularly intact and C-spine is without compromise.  Will treat supportively as outlined below, have patient follow-up with sports medicine given chronicity of issue.  Return precautions discussed, patient verbalized understanding and is agreeable to plan. Final Clinical Impressions(s) / UC Diagnoses   Final diagnoses:  Acute pain of right shoulder  Pain of rhomboid muscle     Discharge Instructions     Recommend RICE: rest, ice, compression, elevation as needed for pain.    Heat therapy (hot compress, warm wash rag, hot showers, etc.) can help relax muscles and soothe muscle aches. Cold therapy (ice packs) can be used to help swelling both after injury and after prolonged use of areas of chronic pain/aches.  For pain: recommend 350 mg-1000 mg of Tylenol (acetaminophen) and/or 200 mg - 800 mg of Advil (ibuprofen, Motrin) every 8 hours as needed.  May alternate between the two throughout the day as they are generally safe to take together.  DO NOT exceed more than 3000 mg of Tylenol or 3200 mg of ibuprofen in a 24 hour period as this could damage your stomach, kidneys, liver, or increase your bleeding risk.  May take muscle relaxer as needed for severe pain / spasm.  (This medication may cause you to become tired so it is important you do not drink alcohol or operate heavy machinery while on this medication.  Recommend your first dose to be taken before bedtime to monitor for side effects safely)  Important to follow up with sports medicine.    ED Prescriptions    Medication Sig Dispense Auth. Provider   cyclobenzaprine (FLEXERIL) 5 MG tablet Take 1 tablet (5 mg total) by mouth at bedtime as needed for muscle spasms. 10 tablet Hall-Potvin, [place], PA-C   naproxen (NAPROSYN) 500 MG tablet Take 1 tablet (500 mg total) by mouth 2 (two) times daily. 30 tablet Hall-Potvin, [place], PA-C     I have reviewed the PDMP during  this encounter.   Hall-Potvin, [place], [place] 02/19/20 1240

## 2020-04-25 ENCOUNTER — Other Ambulatory Visit: Payer: Self-pay

## 2020-04-25 ENCOUNTER — Encounter: Payer: Self-pay | Admitting: Emergency Medicine

## 2020-04-25 ENCOUNTER — Ambulatory Visit
Admission: EM | Admit: 2020-04-25 | Discharge: 2020-04-25 | Disposition: A | Discharge status: Home / Self Care | Payer: Commercial Managed Care - PPO | Attending: Emergency Medicine | Admitting: Emergency Medicine

## 2020-04-25 DIAGNOSIS — J029 Acute pharyngitis, unspecified: Secondary | ICD-10-CM | POA: Diagnosis present

## 2020-04-25 DIAGNOSIS — Z1152 Encounter for screening for COVID-19: Secondary | ICD-10-CM | POA: Insufficient documentation

## 2020-04-25 LAB — POCT RAPID STREP A (OFFICE): Rapid Strep A Screen: NEGATIVE

## 2020-04-25 MED ORDER — AMOXICILLIN 500 MG PO CAPS: 500.0000 mg | ORAL_CAPSULE | Freq: Two times a day (BID) | ORAL | 0 refills | Status: AC

## 2020-04-25 NOTE — Discharge Instructions (Signed)

## 2020-04-25 NOTE — ED Provider Notes (Signed)
[facility]    CSN: [id number] Arrival date & time: 04/25/20  0805      History   Chief Complaint Chief Complaint  Patient presents with  . Sore Throat    HPI Christopher Koch is a 19 y.o. male  Presenting for sore throat and left ear pain x1 week.  Patient endorses history of tonsillitis: States it feels similar.  Denies fever, cough, difficulty breathing.  Does endorse odynophagia.  No chest pain or palpitations, vomiting.  Has tried Tylenol, ibuprofen without relief.  No known sick contacts.  Past Medical History:  Diagnosis Date  . ADD (attention deficit disorder)     Patient Active Problem List   Diagnosis Date Noted  . Cerumen impaction 05/25/2019  . Exercise-induced asthma 05/22/2019  . Foot pain, bilateral 05/22/2019  . Allergic rhinitis 05/22/2019  . GERD (gastroesophageal reflux disease) 05/22/2019    Past Surgical History:  Procedure Laterality Date  . NO PAST SURGERIES         Home Medications    Prior to Admission medications   Medication Sig Start Date End Date Taking? Authorizing Provider  amoxicillin (AMOXIL) 500 MG capsule Take 1 capsule (500 mg total) by mouth 2 (two) times daily for 7 days. 04/25/20 05/02/20  Hall-Potvin, [place], PA-C    Family History Family History  Problem Relation Age of Onset  . Heart disease Maternal Grandmother   . Diabetes Maternal Grandfather     Social History Social History   Tobacco Use  . Smoking status: Never Smoker  . Smokeless tobacco: Never Used  Substance Use Topics  . Alcohol use: Never    Alcohol/week: 0.0 standard drinks  . Drug use: Never     Allergies   Patient has no known allergies.   Review of Systems As per HPI   Physical Exam Triage Vital Signs ED Triage Vitals  Enc Vitals Group     BP 04/25/20 0819 99/63     Pulse Rate 04/25/20 0819 74     Resp 04/25/20 0819 18     Temp 04/25/20 0819 98.8 F (37.1 C)     Temp Source 04/25/20 0819 Oral     SpO2 04/25/20  0819 98 %     Weight --      Height --      Head Circumference --      Peak Flow --      Pain Score 04/25/20 0823 8     Pain Loc --      Pain Edu? --      Excl. in [place]? --    No data found.  Updated Vital Signs BP 99/63 (BP Location: Left Arm)   Pulse 74   Temp 98.8 F (37.1 C) (Oral)   Resp 18   SpO2 98%   Visual Acuity Right Eye Distance:   Left Eye Distance:   Bilateral Distance:    Right Eye Near:   Left Eye Near:    Bilateral Near:     Physical Exam Constitutional:      General: He is not in acute distress.    Appearance: He is well-developed. He is ill-appearing. He is not toxic-appearing or diaphoretic.  HENT:     Head: Normocephalic and atraumatic.     Right Ear: Tympanic membrane and ear canal normal.     Left Ear: Tympanic membrane and ear canal normal.     Ears:     Comments: Right ear with tenderness on otoscopic exam  Mouth/Throat:     Mouth: Mucous membranes are moist.     Pharynx: Oropharynx is clear. Uvula midline. Posterior oropharyngeal erythema present. No uvula swelling.     Tonsils: Tonsillar exudate present. 0 on the right. 2+ on the left.  Eyes:     General: No scleral icterus.    Conjunctiva/sclera: Conjunctivae normal.     Pupils: Pupils are equal, round, and reactive to light.  Neck:     Comments: Trachea midline, negative JVD Cardiovascular:     Rate and Rhythm: Normal rate and regular rhythm.  Pulmonary:     Effort: Pulmonary effort is normal. No respiratory distress.     Breath sounds: No wheezing.  Musculoskeletal:     Cervical back: Neck supple. No tenderness.  Lymphadenopathy:     Cervical: No cervical adenopathy.  Skin:    Capillary Refill: Capillary refill takes less than 2 seconds.     Coloration: Skin is not jaundiced or pale.     Findings: No rash.  Neurological:     Mental Status: He is alert and oriented to person, place, and time.      UC Treatments / Results  Labs (all labs ordered are listed, but only  abnormal results are displayed) Labs Reviewed  NOVEL CORONAVIRUS, NAA  CULTURE, GROUP A STREP [facility])  POCT RAPID STREP A (OFFICE)    EKG   Radiology No results found.  Procedures Procedures (including critical care time)  Medications Ordered in UC Medications - No data to display  Initial Impression / Assessment and Plan / UC Course  I have reviewed the triage vital signs and the nursing notes.  Pertinent labs & imaging results that were available during my care of the patient were reviewed by me and considered in my medical decision making (see chart for details).     Patient afebrile, nontoxic, with SpO2 98%.  Rapid strep negative, culture pending.  Covid PCR pending.  Patient to quarantine until results are back.  We will cover for acute tonsillitis given unilaterality of hypertrophy and exudate.  Return precautions discussed, patient verbalized understanding and is agreeable to plan. Final Clinical Impressions(s) / UC Diagnoses   Final diagnoses:  Encounter for screening for COVID-19  Sore throat     Discharge Instructions     Your rapid strep test was negative today.  The culture is pending.  Please look on your MyChart for test results.   We will notify you if the culture positive and outline a treatment plan at that time.   Please continue Tylenol and/or Ibuprofen as needed for fever, pain.  May try warm salt water gargles, cepacol lozenges, throat spray, warm tea or water with lemon/honey, or OTC cold relief medicine for throat discomfort.    For congestion: take a daily anti-histamine like Zyrtec, Claritin, and a oral decongestant to help with post nasal drip that may be irritating your throat.   It is important to stay hydrated: drink plenty of fluids (primarily water) to keep your throat moisturized and help further relieve irritation/discomfort.     ED Prescriptions    Medication Sig Dispense Auth. Provider   amoxicillin (AMOXIL) 500 MG capsule Take 1  capsule (500 mg total) by mouth 2 (two) times daily for 7 days. 14 capsule Hall-Potvin, [place], PA-C     PDMP not reviewed this encounter.   Hall-Potvin, [place], [place] 04/25/20 [phone]

## 2020-04-25 NOTE — ED Triage Notes (Signed)
Pt here for sore throat x 1 week and left ear pain

## 2020-04-26 LAB — SARS-COV-2, NAA 2 DAY TAT

## 2020-04-26 LAB — NOVEL CORONAVIRUS, NAA: SARS-CoV-2, NAA: NOT DETECTED

## 2020-04-29 LAB — CULTURE, GROUP A STREP (THRC)

## 2020-05-04 ENCOUNTER — Encounter: Payer: Self-pay | Admitting: *Deleted

## 2020-05-04 ENCOUNTER — Ambulatory Visit
Admission: EM | Admit: 2020-05-04 | Discharge: 2020-05-04 | Disposition: A | Discharge status: Home / Self Care | Payer: Commercial Managed Care - PPO | Attending: Emergency Medicine | Admitting: Emergency Medicine

## 2020-05-04 ENCOUNTER — Other Ambulatory Visit: Payer: Self-pay

## 2020-05-04 DIAGNOSIS — Z1152 Encounter for screening for COVID-19: Secondary | ICD-10-CM

## 2020-05-04 DIAGNOSIS — J0391 Acute recurrent tonsillitis, unspecified: Secondary | ICD-10-CM

## 2020-05-04 MED ORDER — AMOXICILLIN-POT CLAVULANATE 875-125 MG PO TABS: 1.0000 | ORAL_TABLET | Freq: Two times a day (BID) | ORAL | 0 refills | Status: DC

## 2020-05-04 NOTE — Discharge Instructions (Signed)
Antibiotic 2 times a day.

## 2020-05-04 NOTE — ED Triage Notes (Addendum)
Patient states that his brother was notified that he was positive for COVID on today. Patient stays in the home with brother.  Patient states he has tonsillitis and severe tonsil stones. Patient states that he has been experiencing symptoms for 2-3 weeks. Patient states he finished Amoxicillin prescription about a week ago for tonsillitis.

## 2020-05-04 NOTE — ED Provider Notes (Signed)
[facility]    CSN: [id number] Arrival date & time: 05/04/20  1529      History   Chief Complaint Chief Complaint  Patient presents with  . Chills  . Fever  . Nausea    HPI Christopher Koch is a 19 y.o. male  Presenting for recurrent tonsillitis.  Patient has a history thereof.  Went to see ENT, though they recommended Covid testing prior to being evaluated.  Endorsing odynophagia, chills, subjective fever.  No chest pain, palpitations, difficulty breathing.  Past Medical History:  Diagnosis Date  . ADD (attention deficit disorder)     Patient Active Problem List   Diagnosis Date Noted  . Cerumen impaction 05/25/2019  . Exercise-induced asthma 05/22/2019  . Foot pain, bilateral 05/22/2019  . Allergic rhinitis 05/22/2019  . GERD (gastroesophageal reflux disease) 05/22/2019    Past Surgical History:  Procedure Laterality Date  . NO PAST SURGERIES         Home Medications    Prior to Admission medications   Medication Sig Start Date End Date Taking? Authorizing Provider  amoxicillin-clavulanate (AUGMENTIN) 875-125 MG tablet Take 1 tablet by mouth every 12 (twelve) hours. 05/04/20   Hall-Potvin, [place], PA-C    Family History Family History  Problem Relation Age of Onset  . Heart disease Maternal Grandmother   . Diabetes Maternal Grandfather     Social History Social History   Tobacco Use  . Smoking status: Never Smoker  . Smokeless tobacco: Never Used  Vaping Use  . Vaping Use: Never used  Substance Use Topics  . Alcohol use: Never    Alcohol/week: 0.0 standard drinks  . Drug use: Never     Allergies   Patient has no known allergies.   Review of Systems As per HPI   Physical Exam Triage Vital Signs ED Triage Vitals  Enc Vitals Group     BP 05/04/20 1548 105/66     Pulse Rate 05/04/20 1548 96     Resp 05/04/20 1548 16     Temp 05/04/20 1548 99 F (37.2 C)     Temp Source 05/04/20 1548 Oral     SpO2 05/04/20 1548 98  %     Weight --      Height --      Head Circumference --      Peak Flow --      Pain Score 05/04/20 1544 10     Pain Loc --      Pain Edu? --      Excl. in GC? --    No data found.  Updated Vital Signs BP 105/66 (BP Location: Left Arm)   Pulse 96   Temp 99 F (37.2 C) (Oral)   Resp 16   SpO2 98%   Visual Acuity Right Eye Distance:   Left Eye Distance:   Bilateral Distance:    Right Eye Near:   Left Eye Near:    Bilateral Near:     Physical Exam Constitutional:      General: He is not in acute distress. HENT:     Head: Normocephalic and atraumatic.     Mouth/Throat:     Lips: Pink.     Mouth: Mucous membranes are moist.     Tonsils: 2+ on the right. 2+ on the left.  Eyes:     General: No scleral icterus.    Pupils: Pupils are equal, round, and reactive to light.  Cardiovascular:     Rate and Rhythm: Normal  rate.  Pulmonary:     Effort: Pulmonary effort is normal. No respiratory distress.     Breath sounds: No wheezing.  Musculoskeletal:     Cervical back: Tenderness present.  Lymphadenopathy:     Cervical: Cervical adenopathy present.  Skin:    Coloration: Skin is not jaundiced or pale.  Neurological:     Mental Status: He is alert and oriented to person, place, and time.      UC Treatments / Results  Labs (all labs ordered are listed, but only abnormal results are displayed) Labs Reviewed  NOVEL CORONAVIRUS, NAA    EKG   Radiology No results found.  Procedures Procedures (including critical care time)  Medications Ordered in UC Medications - No data to display  Initial Impression / Assessment and Plan / UC Course  I have reviewed the triage vital signs and the nursing notes.  Pertinent labs & imaging results that were available during my care of the patient were reviewed by me and considered in my medical decision making (see chart for details).     Patient afebrile, nontoxic, with SpO2 98%.  Declined strep testing.  Covid PCR  pending.  Patient to quarantine until results are back.  We will treat for recurrent tonsillitis with Augmentin as outlined below.  Stressed importance of ENT follow-up.  Return precautions discussed, patient verbalized understanding and is agreeable to plan. Final Clinical Impressions(s) / UC Diagnoses   Final diagnoses:  Recurrent tonsillitis     Discharge Instructions     Antibiotic 2 times a day.    ED Prescriptions    Medication Sig Dispense Auth. Provider   amoxicillin-clavulanate (AUGMENTIN) 875-125 MG tablet Take 1 tablet by mouth every 12 (twelve) hours. 14 tablet Hall-Potvin, [place], PA-C     PDMP not reviewed this encounter.   Hall-Potvin, [place], [place] 05/04/20 1713

## 2020-05-06 LAB — NOVEL CORONAVIRUS, NAA: SARS-CoV-2, NAA: DETECTED — AB

## 2020-05-06 LAB — SARS-COV-2, NAA 2 DAY TAT

## 2020-05-07 ENCOUNTER — Telehealth (HOSPITAL_COMMUNITY): Payer: Self-pay

## 2020-05-07 NOTE — Telephone Encounter (Signed)
RN attempted to reach out to screen patient for possible monoclonal antibody infusion for COVID, unable to leave a VM.

## 2020-05-14 ENCOUNTER — Other Ambulatory Visit: Payer: Self-pay

## 2020-05-14 ENCOUNTER — Ambulatory Visit
Admission: EM | Admit: 2020-05-14 | Discharge: 2020-05-14 | Disposition: A | Discharge status: Home / Self Care | Payer: Commercial Managed Care - PPO | Attending: Emergency Medicine | Admitting: Emergency Medicine

## 2020-05-14 ENCOUNTER — Encounter: Payer: Self-pay | Admitting: Emergency Medicine

## 2020-05-14 DIAGNOSIS — J029 Acute pharyngitis, unspecified: Secondary | ICD-10-CM | POA: Diagnosis not present

## 2020-05-14 MED ORDER — CETIRIZINE HCL 10 MG PO CAPS: 10.0000 mg | ORAL_CAPSULE | Freq: Every day | ORAL | 0 refills | Status: AC

## 2020-05-14 MED ORDER — PREDNISONE 20 MG PO TABS: 40.0000 mg | ORAL_TABLET | Freq: Every day | ORAL | 0 refills | Status: AC

## 2020-05-14 MED ORDER — CEFDINIR 300 MG PO CAPS: 300.0000 mg | ORAL_CAPSULE | Freq: Two times a day (BID) | ORAL | 0 refills | Status: AC

## 2020-05-14 NOTE — Discharge Instructions (Signed)
Follow-up with ENT as planned Begin Omnicef twice daily for 10 days Cetirizine daily for 10 days to help with postnasal drainage contributing to throat pain Prednisone 40 mg daily for 5 days with food to help with swelling Warm compresses to the neck to help with swollen lymph nodes Return if not improving or worsening

## 2020-05-14 NOTE — ED Triage Notes (Signed)
Pt here for sore throat with hx of same; pt sts taking antibiotics with some improvement but returns once antibiotics are completed; pt has appointment with [facility] ENT on 10/15; pt tested positive for covid on 9/21

## 2020-05-14 NOTE — ED Provider Notes (Signed)
[facility]    CSN: [id number] Arrival date & time: 05/14/20  5400      History   Chief Complaint Chief Complaint  Patient presents with  . Sore Throat    HPI Christopher Koch is a 19 y.o. male presenting today for evaluation of sore throat. Patient reports that he has had recurrent tonsillitis over the past couple months. Was seen here approximately 10 days ago and tested positive for Covid as well as treated with with Augmentin for tonsillitis. States that he has been on recurrent antibiotics over the past month and symptoms returned right after completion. He has plans to follow-up with ENT on October 15. He denies other URI symptoms. Denies fevers. Does report mucus in throat. Has not been tested for mono since onset of symptoms.  HPI  Past Medical History:  Diagnosis Date  . ADD (attention deficit disorder)     Patient Active Problem List   Diagnosis Date Noted  . Cerumen impaction 05/25/2019  . Exercise-induced asthma 05/22/2019  . Foot pain, bilateral 05/22/2019  . Allergic rhinitis 05/22/2019  . GERD (gastroesophageal reflux disease) 05/22/2019    Past Surgical History:  Procedure Laterality Date  . NO PAST SURGERIES         Home Medications    Prior to Admission medications   Medication Sig Start Date End Date Taking? Authorizing Provider  cefdinir (OMNICEF) 300 MG capsule Take 1 capsule (300 mg total) by mouth 2 (two) times daily for 10 days. 05/14/20 05/24/20  Maicie Vanderloop C, PA-C  Cetirizine HCl 10 MG CAPS Take 1 capsule (10 mg total) by mouth daily for 10 days. 05/14/20 05/24/20  Lyrick Worland C, PA-C  predniSONE (DELTASONE) 20 MG tablet Take 2 tablets (40 mg total) by mouth daily for 5 days. 05/14/20 05/19/20  Branda Chaudhary, Junius [place], PA-C    Family History Family History  Problem Relation Age of Onset  . Heart disease Maternal Grandmother   . Diabetes Maternal Grandfather     Social History Social History   Tobacco Use  . Smoking  status: Never Smoker  . Smokeless tobacco: Never Used  Vaping Use  . Vaping Use: Never used  Substance Use Topics  . Alcohol use: Never    Alcohol/week: 0.0 standard drinks  . Drug use: Never     Allergies   Patient has no known allergies.   Review of Systems Review of Systems  Constitutional: Negative for activity change, appetite change, chills, fatigue and fever.  HENT: Positive for sore throat. Negative for congestion, ear pain, rhinorrhea, sinus pressure and trouble swallowing.   Eyes: Negative for discharge and redness.  Respiratory: Negative for cough, chest tightness and shortness of breath.   Cardiovascular: Negative for chest pain.  Gastrointestinal: Negative for abdominal pain, diarrhea, nausea and vomiting.  Musculoskeletal: Negative for myalgias.  Skin: Negative for rash.  Neurological: Negative for dizziness, light-headedness and headaches.     Physical Exam Triage Vital Signs ED Triage Vitals  Enc Vitals Group     BP 05/14/20 0831 109/74     Pulse Rate 05/14/20 0831 90     Resp 05/14/20 0831 18     Temp 05/14/20 0831 98.5 F (36.9 C)     Temp Source 05/14/20 0831 Oral     SpO2 05/14/20 0831 98 %     Weight --      Height --      Head Circumference --      Peak Flow --  Pain Score 05/14/20 0832 10     Pain Loc --      Pain Edu? --      Excl. in GC? --    No data found.  Updated Vital Signs BP 109/74 (BP Location: Left Arm)   Pulse 90   Temp 98.5 F (36.9 C) (Oral)   Resp 18   SpO2 98%   Visual Acuity Right Eye Distance:   Left Eye Distance:   Bilateral Distance:    Right Eye Near:   Left Eye Near:    Bilateral Near:     Physical Exam Vitals and nursing note reviewed.  Constitutional:      Appearance: He is well-developed.     Comments: No acute distress  HENT:     Head: Normocephalic and atraumatic.     Ears:     Comments: Bilateral ears without tenderness to palpation of external auricle, tragus and mastoid, EAC's without  erythema or swelling, TM's with good bony landmarks and cone of light. Non erythematous.     Nose: Nose normal.     Mouth/Throat:     Comments: Bilateral tonsils do appear moderately enlarged, mildly erythematous without exudate, posterior pharynx patent, uvula midline, no soft palate swelling Eyes:     Conjunctiva/sclera: Conjunctivae normal.  Neck:     Comments: Bilateral cervical lymphadenopathy Cardiovascular:     Rate and Rhythm: Normal rate.  Pulmonary:     Effort: Pulmonary effort is normal. No respiratory distress.     Comments: Breathing comfortably at rest, CTABL, no wheezing, rales or other adventitious sounds auscultated Abdominal:     General: There is no distension.  Musculoskeletal:        General: Normal range of motion.     Cervical back: Neck supple.  Skin:    General: Skin is warm and dry.  Neurological:     Mental Status: He is alert and oriented to person, place, and time.      UC Treatments / Results  Labs (all labs ordered are listed, but only abnormal results are displayed) Labs Reviewed - No data to display  EKG   Radiology No results found.  Procedures Procedures (including critical care time)  Medications Ordered in UC Medications - No data to display  Initial Impression / Assessment and Plan / UC Course  I have reviewed the triage vital signs and the nursing notes.  Pertinent labs & imaging results that were available during my care of the patient were reviewed by me and considered in my medical decision making (see chart for details).     Recurrent tonsillitis-recommended mono testing, but currently out of mono test at our clinic. Placing on Omnicef as alternative antibiotic. Recommended daily antihistamine to help with any drainage contributing to sore throat. Prednisone x5 days to help with swelling. Follow-up with ENT as planned.  Discussed strict return precautions. Patient verbalized understanding and is agreeable with  plan.  Final Clinical Impressions(s) / UC Diagnoses   Final diagnoses:  Sore throat     Discharge Instructions     Follow-up with ENT as planned Begin Omnicef twice daily for 10 days Cetirizine daily for 10 days to help with postnasal drainage contributing to throat pain Prednisone 40 mg daily for 5 days with food to help with swelling Warm compresses to the neck to help with swollen lymph nodes Return if not improving or worsening     ED Prescriptions    Medication Sig Dispense Auth. Provider   predniSONE (DELTASONE) 20  MG tablet Take 2 tablets (40 mg total) by mouth daily for 5 days. 10 tablet Jahmil Macleod C, PA-C   Cetirizine HCl 10 MG CAPS Take 1 capsule (10 mg total) by mouth daily for 10 days. 10 capsule Emylie Amster C, PA-C   cefdinir (OMNICEF) 300 MG capsule Take 1 capsule (300 mg total) by mouth 2 (two) times daily for 10 days. 20 capsule Frayda Egley, [place] C, PA-C     PDMP not reviewed this encounter.   Litsy Epting, [place] C, PA-C 05/14/20 [phone]

## 2020-05-27 ENCOUNTER — Other Ambulatory Visit: Payer: Self-pay

## 2020-05-27 ENCOUNTER — Ambulatory Visit
Admission: EM | Admit: 2020-05-27 | Discharge: 2020-05-27 | Disposition: A | Discharge status: Home / Self Care | Payer: Commercial Managed Care - PPO | Attending: Family Medicine | Admitting: Family Medicine

## 2020-05-27 ENCOUNTER — Encounter: Payer: Self-pay | Admitting: Emergency Medicine

## 2020-05-27 DIAGNOSIS — J3501 Chronic tonsillitis: Secondary | ICD-10-CM | POA: Diagnosis not present

## 2020-05-27 MED ORDER — DEXAMETHASONE SODIUM PHOSPHATE 10 MG/ML IJ SOLN
10.0000 mg | Freq: Once | INTRAMUSCULAR | Status: AC
  Administered 2020-05-27: 10 mg via INTRAMUSCULAR

## 2020-05-27 MED ORDER — CEFTRIAXONE SODIUM 500 MG IJ SOLR
500.0000 mg | Freq: Once | INTRAMUSCULAR | Status: AC
  Administered 2020-05-27: 500 mg via INTRAMUSCULAR

## 2020-05-27 MED ORDER — AMOXICILLIN 500 MG PO TABS: 500.0000 mg | ORAL_TABLET | Freq: Every day | ORAL | 0 refills | Status: AC

## 2020-05-27 NOTE — Discharge Instructions (Signed)
You have received a steroid and an antibiotic injection in the office today  I have sent in amoxicillin for you to take daily for the next 21 days to help keep symptoms at bay  Follow up with ENT as scheduled  Follow up with the ER for high fever, trouble swallowing, trouble breathing, other concerning symptoms

## 2020-05-27 NOTE — ED Triage Notes (Signed)
Pt here for throat pain after completing antibiotics; pt has sx scheduled for 11/8 to have tonsils removed; pt has been seen here multiple times for same

## 2020-05-27 NOTE — ED Provider Notes (Signed)
[facility]   [id number] 05/27/20 Arrival Time: 0901  XT:GGYI THROAT  SUBJECTIVE: History from: patient.  Christopher Koch is a 19 y.o. male who presents with chronic tonsillitis for the last couple of months.  Has been seen by ENT, and has surgery scheduled for tonsillectomy on 06/21/2020.  Reports that every time he is on antibiotics he feels better, 2 days after he stops his symptoms are back.  Has recently had Covid as well, on 05/04/2020. Has not completed Covid vaccines. Symptoms are made worse with swallowing, but tolerating liquids and own secretions without difficulty.  Denies fever, chills, fatigue, ear pain, sinus pain, rhinorrhea, nasal congestion, cough, SOB, wheezing, chest pain, nausea, rash, changes in bowel or bladder habits.     ROS: As per HPI.  All other pertinent ROS negative.     Past Medical History:  Diagnosis Date  . ADD (attention deficit disorder)    Past Surgical History:  Procedure Laterality Date  . NO PAST SURGERIES     No Known Allergies No current facility-administered medications on file prior to encounter.   Current Outpatient Medications on File Prior to Encounter  Medication Sig Dispense Refill  . Cetirizine HCl 10 MG CAPS Take 1 capsule (10 mg total) by mouth daily for 10 days. 10 capsule 0   Social History   Socioeconomic History  . Marital status: Single    Spouse name: Not on file  . Number of children: Not on file  . Years of education: Not on file  . Highest education level: Not on file  Occupational History  . Not on file  Tobacco Use  . Smoking status: Never Smoker  . Smokeless tobacco: Never Used  Vaping Use  . Vaping Use: Never used  Substance and Sexual Activity  . Alcohol use: Never    Alcohol/week: 0.0 standard drinks  . Drug use: Never  . Sexual activity: Never  Other Topics Concern  . Not on file  Social History Narrative  . Not on file   Social Determinants of Health   Financial Resource Strain:     . Difficulty of Paying Living Expenses: Not on file  Food Insecurity:   . Worried About Programme researcher, broadcasting/film/video in the Last Year: Not on file  . Ran Out of Food in the Last Year: Not on file  Transportation Needs:   . Lack of Transportation (Medical): Not on file  . Lack of Transportation (Non-Medical): Not on file  Physical Activity:   . Days of Exercise per Week: Not on file  . Minutes of Exercise per Session: Not on file  Stress:   . Feeling of Stress : Not on file  Social Connections:   . Frequency of Communication with Friends and Family: Not on file  . Frequency of Social Gatherings with Friends and Family: Not on file  . Attends Religious Services: Not on file  . Active Member of Clubs or Organizations: Not on file  . Attends Banker Meetings: Not on file  . Marital Status: Not on file  Intimate Partner Violence:   . Fear of Current or Ex-Partner: Not on file  . Emotionally Abused: Not on file  . Physically Abused: Not on file  . Sexually Abused: Not on file   Family History  Problem Relation Age of Onset  . Heart disease Maternal Grandmother   . Diabetes Maternal Grandfather     OBJECTIVE:  Vitals:   05/27/20 0919  BP: 96/63  Pulse: 69  Resp: 18  Temp: (!) 97.3 F (36.3 C)  TempSrc: Oral  SpO2: 98%     General appearance: alert; appears fatigued, but nontoxic, speaking in full sentences and managing own secretions HEENT: NCAT; Ears: EACs clear, TMs pearly gray with visible cone of light, without erythema; Eyes: PERRL, EOMI grossly; Nose: no obvious rhinorrhea; Throat: oropharynx erythematous, tonsils 2+ and mildly erythematous with white tonsillar exudates, uvula midline Neck: supple with LAD Lungs: CTA bilaterally without adventitious breath sounds; cough absent Heart: regular rate and rhythm.  Radial pulses 2+ symmetrical bilaterally Skin: warm and dry Psychological: alert and cooperative; normal mood and affect  LABS: No results found for  this or any previous visit (from the past 24 hour(s)).   ASSESSMENT & PLAN:  1. Chronic tonsillitis     Meds ordered this encounter  Medications  . cefTRIAXone (ROCEPHIN) injection 500 mg  . dexamethasone (DECADRON) injection 10 mg  . amoxicillin (AMOXIL) 500 MG tablet    Sig: Take 1 tablet (500 mg total) by mouth daily for 21 days.    Dispense:  21 tablet    Refill:  0    Order Specific Question:   Supervising Provider    Answer:   Merrilee Jansky [[id number]]   Rocephin 500 mg IM in office today Decadron 10 mg IM in office today Amoxicillin 500 mg once daily for the next 3 weeks Has known chronic tonsillitis, previous strep cultures have been negative and has tonsillectomy scheduled on 06/21/2020 Get plenty of rest and push fluids Take OTC Zyrtec and use chloraseptic spray as needed for throat pain. Drink warm or cool liquids, use throat lozenges, or popsicles to help alleviate symptoms Take OTC ibuprofen or tylenol as needed for pain  Follow-up with ENT as scheduled Return or go to ER if patient has any new or worsening symptoms such as fever, chills, nausea, vomiting, worsening sore throat, cough, abdominal pain, chest pain, changes in bowel or bladder habits  Reviewed expectations re: course of current medical issues. Questions answered. Outlined signs and symptoms indicating need for more acute intervention. Patient verbalized understanding. After Visit Summary given.          Moshe Cipro, NP 05/27/20 1037

## 2021-07-15 ENCOUNTER — Ambulatory Visit: Payer: Self-pay | Admitting: *Deleted

## 2021-07-15 NOTE — Telephone Encounter (Signed)
  Patient calls to report he is having severe burning with urination, fatigue, dizziness. Patient advised need for evaluation- UC advsied  Reason for Disposition  [1] SEVERE pain with urination (e.g., excruciating) AND [2] not improved after 2 hours of pain medicine (e.g., acetaminophen or ibuprofen)  Answer Assessment - Initial Assessment Questions 1. SYMPTOM: "What's the main symptom you're concerned about?" (e.g., frequency, incontinence)     burning 2. ONSET: "When did the  burning   start?"     Ongoing- 2 years 3. PAIN: "Is there any pain?" If Yes, ask: "How bad is it?" (Scale: 1-10; mild, moderate, severe)     severe 4. CAUSE: "What do you think is causing the symptoms?"     unknown 5. OTHER SYMPTOMS: "Do you have any other symptoms?" (e.g., fever, flank pain, blood in urine, pain with urination)     Dizziness,fatigue 6. PREGNANCY: "Is there any chance you are pregnant?" "When was your last menstrual period?"     na  Protocols used: Urinary Symptoms-A-AH, Urination Pain - Male-A-AH

## 2021-07-18 ENCOUNTER — Telehealth: Payer: Self-pay | Admitting: Family Medicine

## 2021-07-18 NOTE — Telephone Encounter (Signed)
Copied from CRM [id number]. Topic: General - Other >> Jul 18, 2021  2:30 PM Gaetana Michaelis A wrote: Reason for CRM: The patient has called to inquire further about financial counseling  The patient is interested in applying for their orange card  Please contact further when available

## 2021-07-18 NOTE — Telephone Encounter (Signed)
I return Pt call, I explain him that he need to see the Provider first then he can apply for the financial program that the clinic offers

## 2021-08-19 IMAGING — DX DG CHEST 2V
2 series · 2 of 2 positions shown · non-contrast
Comparison: 01/02/2015

CLINICAL DATA: Chronic shortness of breath for 1 year.

EXAM:
CHEST - 2 VIEW

[chest pa]
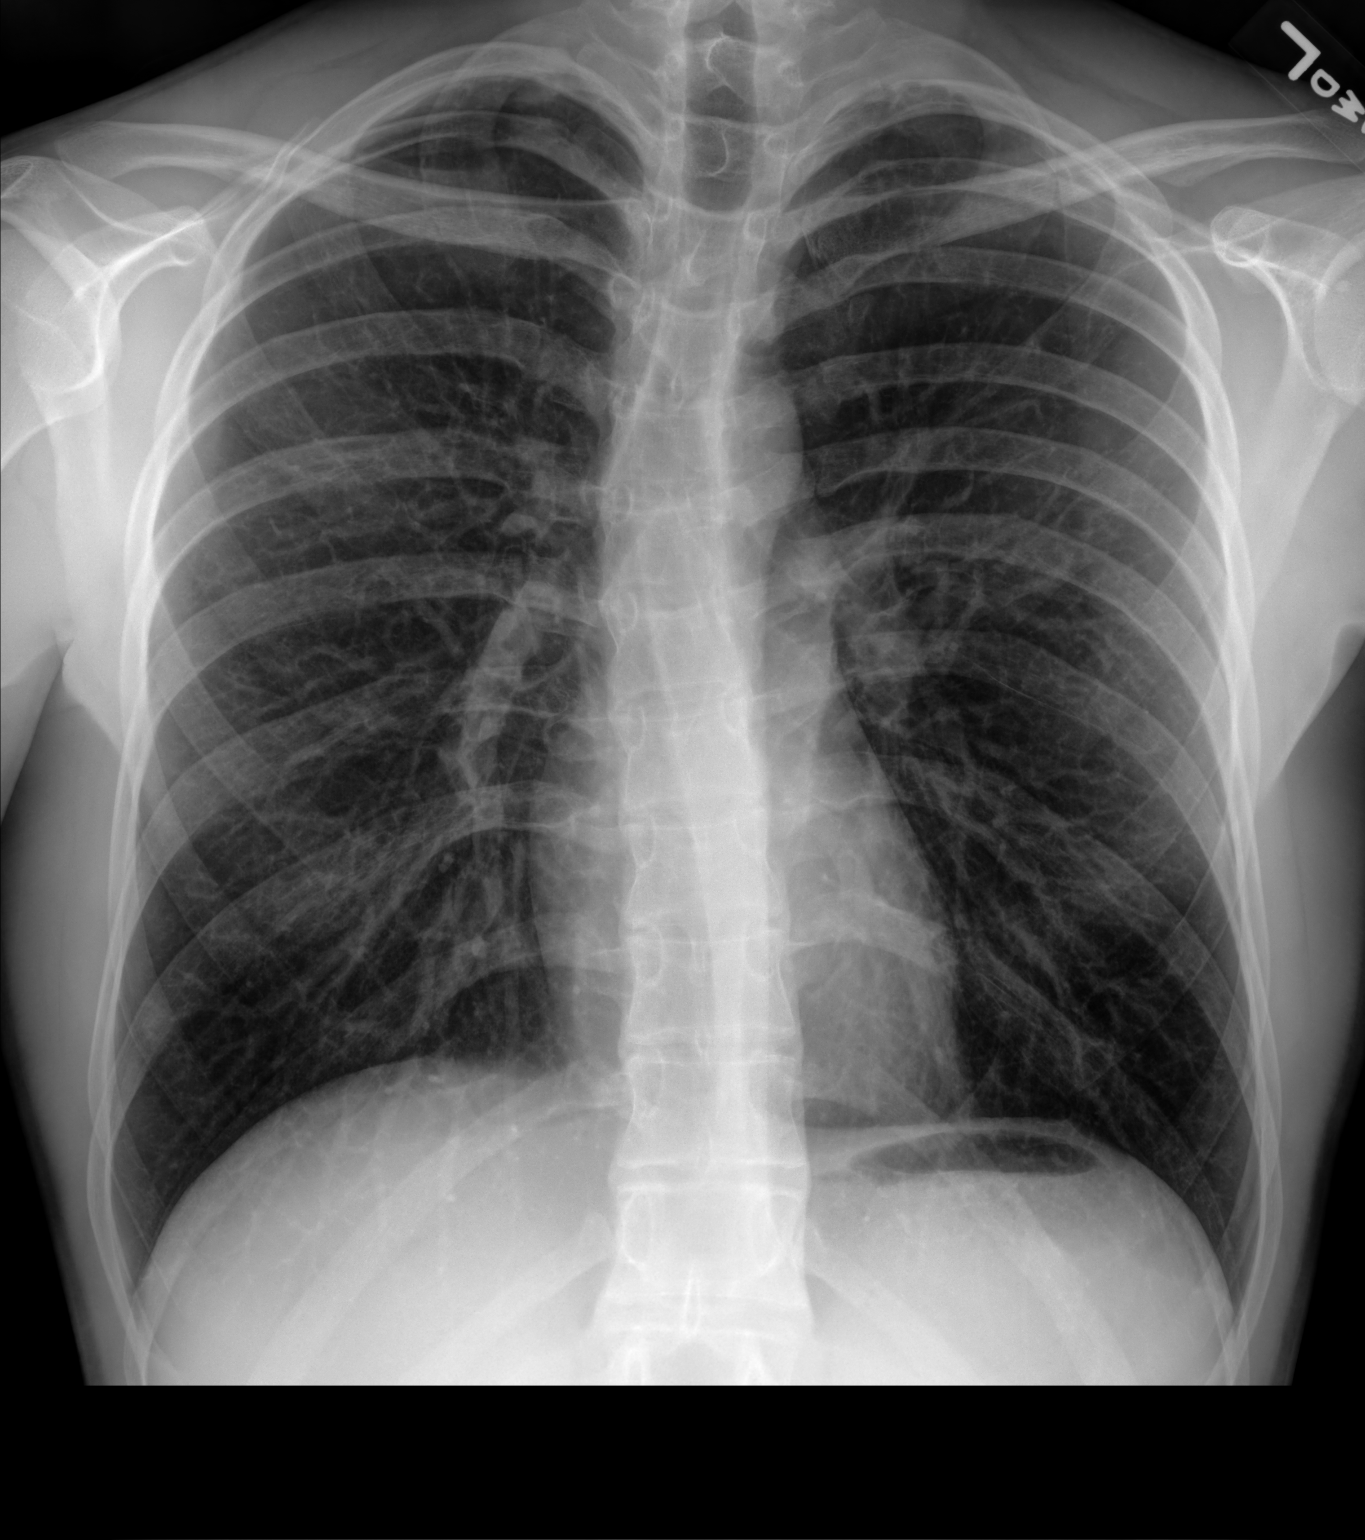

[chest lat]
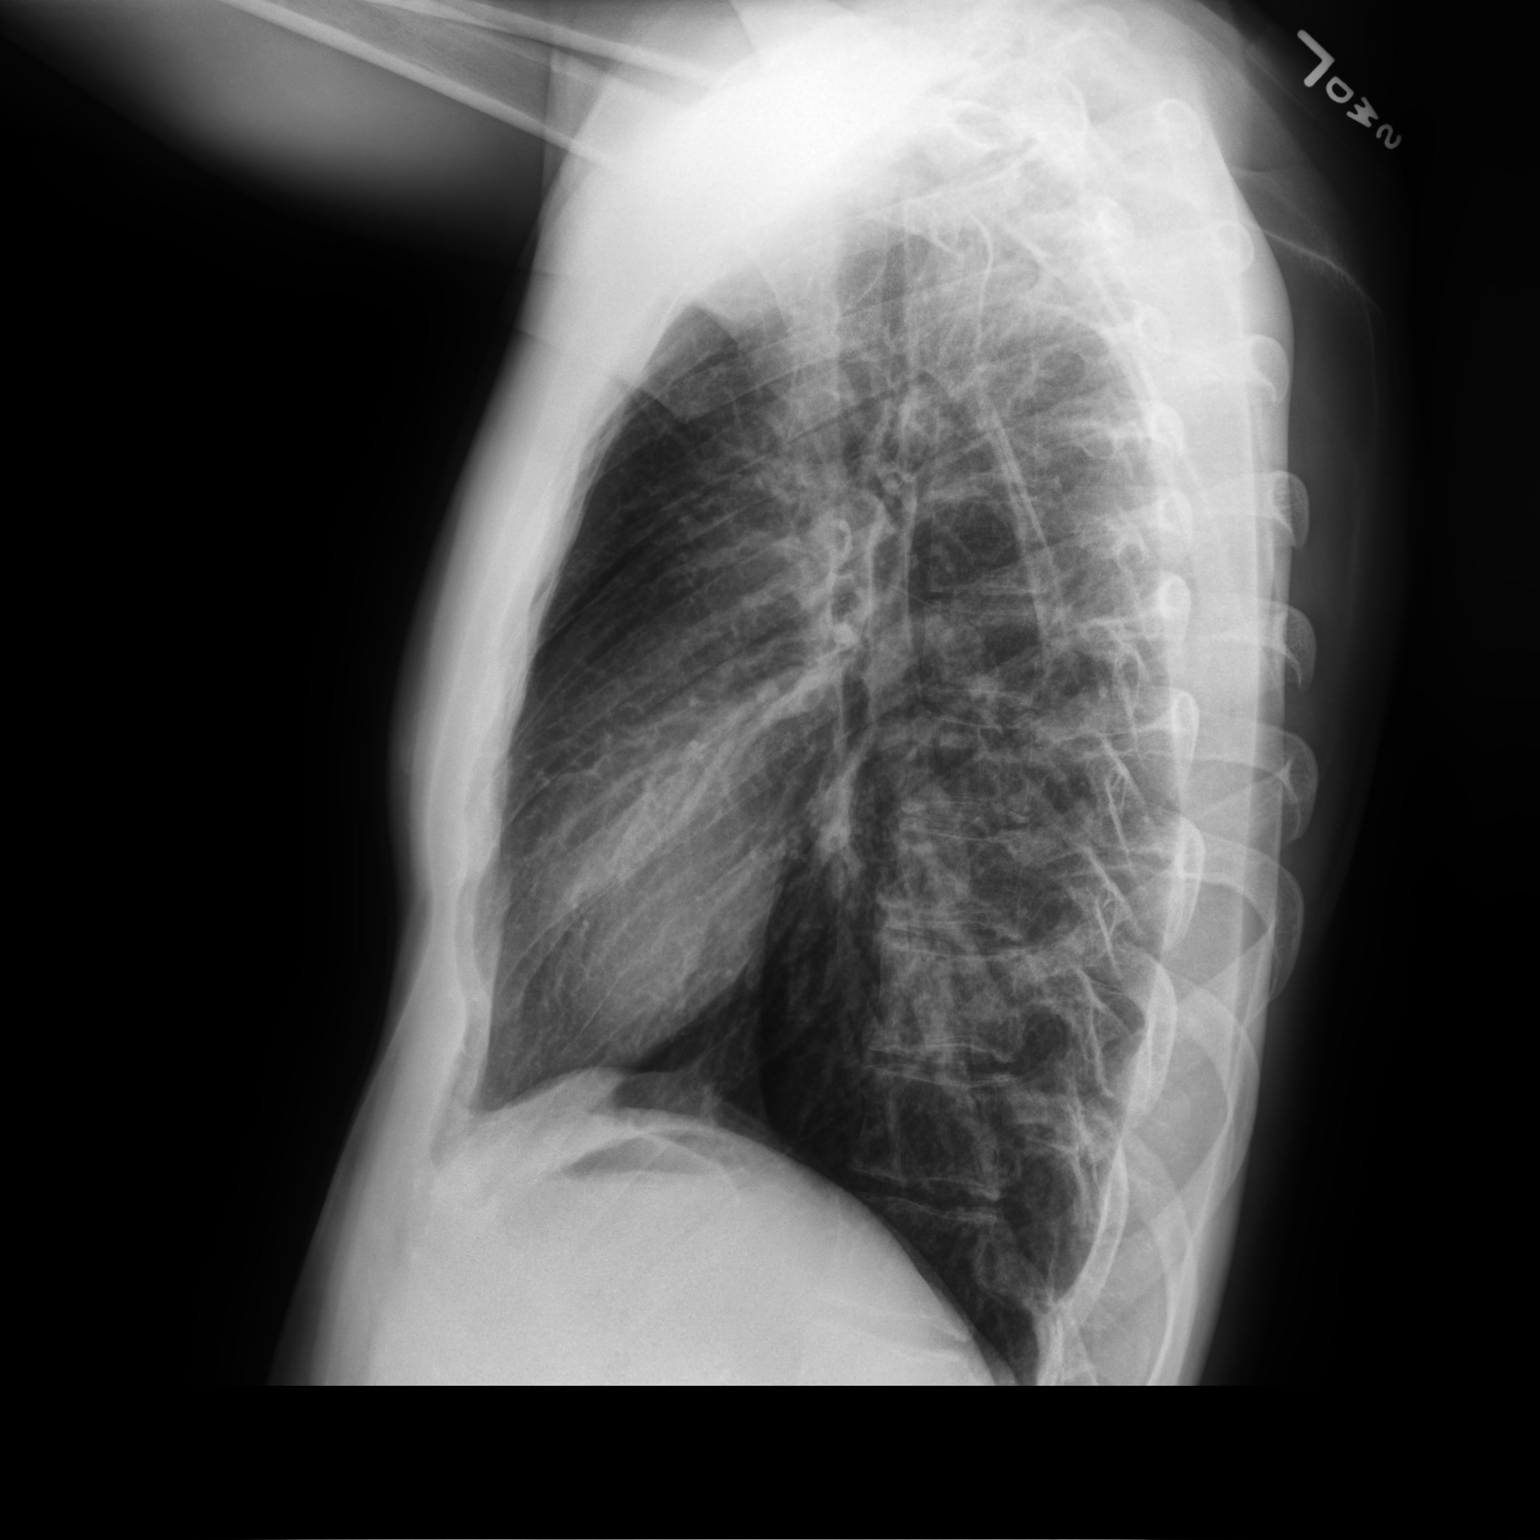

[2 of 2 positions shown; findings below may reference images not displayed]

FINDINGS: The cardiomediastinal silhouette is unremarkable.

There is no evidence of focal airspace disease, pulmonary edema,
suspicious pulmonary nodule/mass, pleural effusion, or pneumothorax.

No acute bony abnormalities are identified. A mild apex RIGHT
thoracic scoliosis is again noted.
IMPRESSION: No active cardiopulmonary disease.

## 2021-09-13 ENCOUNTER — Ambulatory Visit: Payer: Self-pay | Admitting: Family Medicine

## 2023-07-03 ENCOUNTER — Ambulatory Visit (INDEPENDENT_AMBULATORY_CARE_PROVIDER_SITE_OTHER): Payer: Medicaid Other

## 2023-07-03 ENCOUNTER — Encounter: Payer: Self-pay | Admitting: Cardiology

## 2023-07-03 ENCOUNTER — Ambulatory Visit: Payer: Medicaid Other | Attending: Cardiology | Admitting: Cardiology

## 2023-07-03 VITALS — BP 90/70 | HR 66 | Ht 75.0 in | Wt 142.8 lb

## 2023-07-03 DIAGNOSIS — R002 Palpitations: Secondary | ICD-10-CM

## 2023-07-03 DIAGNOSIS — Z136 Encounter for screening for cardiovascular disorders: Secondary | ICD-10-CM

## 2023-07-03 DIAGNOSIS — R5383 Other fatigue: Secondary | ICD-10-CM

## 2023-07-03 DIAGNOSIS — R0789 Other chest pain: Secondary | ICD-10-CM | POA: Diagnosis not present

## 2023-07-03 DIAGNOSIS — Z7689 Persons encountering health services in other specified circumstances: Secondary | ICD-10-CM

## 2023-07-03 NOTE — Patient Instructions (Addendum)
Medication Instructions:  Your physician recommends that you continue on your current medications as directed. Please refer to the Current Medication list given to you today.  *If you need a refill on your cardiac medications before your next appointment, please call your pharmacy*   Lab Work: TSH If you have labs (blood work) drawn today and your tests are completely normal, you will receive your results only by: MyChart Message (if you have MyChart) OR A paper copy in the mail If you have any lab test that is abnormal or we need to change your treatment, we will call you to review the results.   Testing/Procedures: Your physician has requested that you have an echocardiogram -[place]. Echocardiography is a painless test that uses sound waves to create images of your heart. It provides your doctor with information about the size and shape of your heart and how well your heart's chambers and valves are working. This procedure takes approximately one hour. There are no restrictions for this procedure. Please do NOT wear cologne, perfume, aftershave, or lotions (deodorant is allowed). Please arrive 15 minutes prior to your appointment time.  Please note: We ask at that you not bring children with you during ultrasound (echo/ vascular) testing. Due to room size and safety concerns, children are not allowed in the ultrasound rooms during exams. Our front office staff cannot provide observation of children in our lobby area while testing is being conducted. An adult accompanying a patient to their appointment will only be allowed in the ultrasound room at the discretion of the ultrasound technician under special circumstances. We apologize for any inconvenience.  ZIO XT- Long Term Monitor Instructions  Your physician has requested you wear a ZIO patch monitor for 14 days.  This is a single patch monitor. [employer] supplies one patch monitor per enrollment. Additional stickers are not available.  Please do not apply patch if you will be having a Nuclear Stress Test,  Echocardiogram, Cardiac CT, MRI, or Chest Xray during the period you would be wearing the  monitor. The patch cannot be worn during these tests. You cannot remove and re-apply the  ZIO XT patch monitor.  Your ZIO patch monitor will be mailed 3 day USPS to your address on file. It may take 3-5 days  to receive your monitor after you have been enrolled.  Once you have received your monitor, please review the enclosed instructions. Your monitor  has already been registered assigning a specific monitor serial # to you.  Billing and Patient Assistance Program Information  We have supplied [employer] with any of your insurance information on file for billing purposes. [employer] offers a sliding scale Patient Assistance Program for patients that do not have  insurance, or whose insurance does not completely cover the cost of the ZIO monitor.  You must apply for the Patient Assistance Program to qualify for this discounted rate.  To apply, please call [employer] at [phone], select option 4, select option 2, ask to apply for  Patient Assistance Program. Meredeth Ide will ask your household income, and how many people  are in your household. They will quote your out-of-pocket cost based on that information.  [employer] will also be able to set up a 27-month, interest-free payment plan if needed.  Applying the monitor   Shave hair from upper left chest.  Hold abrader disc by orange tab. Rub abrader in 40 strokes over the upper left chest as  indicated in your monitor instructions.  Clean area with 4 enclosed alcohol  pads. Let dry.  Apply patch as indicated in monitor instructions. Patch will be placed under collarbone on left  side of chest with arrow pointing upward.  Rub patch adhesive wings for 2 minutes. Remove white label marked "1". Remove the white  label marked "2". Rub patch adhesive wings for 2 additional minutes.  While  looking in a mirror, press and release button in center of patch. A small green light will  flash 3-4 times. This will be your only indicator that the monitor has been turned on.  Do not shower for the first 24 hours. You may shower after the first 24 hours.  Press the button if you feel a symptom. You will hear a small click. Record Date, Time and  Symptom in the Patient Logbook.  When you are ready to remove the patch, follow instructions on the last 2 pages of Patient  Logbook. Stick patch monitor onto the last page of Patient Logbook.  Place Patient Logbook in the blue and white box. Use locking tab on box and tape box closed  securely. The blue and white box has prepaid postage on it. Please place it in the mailbox as  soon as possible. Your physician should have your test results approximately 7 days after the  monitor has been mailed back to [facility].  Call [facility] Customer Care at [phone] if you have questions regarding  your ZIO XT patch monitor. Call them immediately if you see an orange light blinking on your  monitor.  If your monitor falls off in less than 4 days, contact our Monitor department at [phone].  If your monitor becomes loose or falls off after 4 days call [employer] at [phone] for  suggestions on securing your monitor    Follow-Up: At [facility], you and your health needs are our priority.  As part of our continuing mission to provide you with exceptional heart care, we have created designated Provider Care Teams.  These Care Teams include your primary Cardiologist (physician) and Advanced Practice Providers (APPs -  Physician Assistants and Nurse Practitioners) who all work together to provide you with the care you need, when you need it.   Your next appointment:   15 week(s)  Provider:   Thomasene Ripple, DO

## 2023-07-03 NOTE — Progress Notes (Signed)
fhj

## 2023-07-03 NOTE — Progress Notes (Signed)
Cardiology Office Note:    Date:  07/06/2023   ID:  Christopher Koch, DOB January 15, 2001, MRN [id number]  PCP:  Christopher Neighbors, NP  Cardiologist:  Christopher Ripple, DO  Electrophysiologist:  None   Referring MD: Christopher Neighbors, NP   " I am having some symptoms"   History of Present Illness:    Christopher Koch is a 22 y.o. male  who presents with intermittent, sharp chest pain and lightheadedness. The chest pain is described as 'violent' and 'random,' causing significant distress. The lightheadedness is particularly noticeable after eating heavier or fatty meals, and is often accompanied by excessive thirst. He reports needing to lie down to alleviate the symptoms. He denies any episodes of syncope but recalls multiple instances where he felt close to fainting.  He also reports a sensation of his heart 'racing' and 'skipping a beat.' He has a family history of heart problems, with his grandmother having undergone open heart surgery and his uncle having definitive heart issues. He is a former smoker and denies alcohol use.  Christopher Koch has been previously diagnosed with exercise-induced asthma, but he no longer experiences symptoms since adopting a healthier lifestyle. He is physically active, playing basketball and working out regularly. He also reports chronic low blood pressure.  He is currently living with his brother-in-law and anticipates that his diet may not be as healthy as usual due to recent changes in his living situation.  Past Medical History:  Diagnosis Date   ADD (attention deficit disorder)     Past Surgical History:  Procedure Laterality Date   NO PAST SURGERIES      Current Medications: No outpatient medications have been marked as taking for the 07/03/23 encounter (Office Visit) with Christopher Ripple, DO.     Allergies:   Patient has no known allergies.   Social History   Socioeconomic History   Marital status: Single    Spouse name: Not on file   Number of  children: Not on file   Years of education: Not on file   Highest education level: Not on file  Occupational History   Not on file  Tobacco Use   Smoking status: Never   Smokeless tobacco: Never  Vaping Use   Vaping status: Never Used  Substance and Sexual Activity   Alcohol use: Never    Alcohol/week: 0.0 standard drinks of alcohol   Drug use: Never   Sexual activity: Never  Other Topics Concern   Not on file  Social History Narrative   Not on file   Social Determinants of Health   Financial Resource Strain: Medium Risk (02/01/2023)   Received from [facility]   Overall Financial Resource Strain (CARDIA)    Difficulty of Paying Living Expenses: Somewhat hard  Food Insecurity: Food Insecurity Present (02/01/2023)   Received from [facility]   Hunger Vital Sign    Worried About Running Out of Food in the Last Year: Sometimes true    Ran Out of Food in the Last Year: Sometimes true  Transportation Needs: No Transportation Needs (02/01/2023)   Received from [facility]   PRAPARE - Transportation    Lack of Transportation (Medical): No    Lack of Transportation (Non-Medical): No  Physical Activity: Not on file  Stress: Not on file  Social Connections: Not on file     Family History: The patient's family history includes Diabetes in his maternal grandfather; Heart disease in his maternal grandmother.  ROS:   Review  of Systems  Constitution: Negative for decreased appetite, fever and weight gain.  HENT: Negative for congestion, ear discharge, hoarse voice and sore throat.   Eyes: Negative for discharge, redness, vision loss in right eye and visual halos.  Cardiovascular:Reports chest pain, dyspnea on exertion. Negative for leg swelling, orthopnea and palpitations.  Respiratory: Negative for cough, hemoptysis, shortness of breath and snoring.   Endocrine: Negative for heat intolerance and polyphagia.  Hematologic/Lymphatic: Negative for bleeding problem. Does not  bruise/bleed easily.  Skin: Negative for flushing, nail changes, rash and suspicious lesions.  Musculoskeletal: Negative for arthritis, joint pain, muscle cramps, myalgias, neck pain and stiffness.  Gastrointestinal: Negative for abdominal pain, bowel incontinence, diarrhea and excessive appetite.  Genitourinary: Negative for decreased libido, genital sores and incomplete emptying.  Neurological: Negative for brief paralysis, focal weakness, headaches and loss of balance.  Psychiatric/Behavioral: Negative for altered mental status, depression and suicidal ideas.  Allergic/Immunologic: Negative for HIV exposure and persistent infections.    EKGs/Labs/Other Studies Reviewed:    The following studies were reviewed today:   EKG:  The ekg ordered today demonstrates sinus rhythm with arrhythmia   Recent Labs: 07/03/2023: TSH 2.300  Recent Lipid Panel No results found for: "CHOL", "TRIG", "HDL", "CHOLHDL", "VLDL", "LDLCALC", "LDLDIRECT"  Physical Exam:    VS:  BP 90/70 (BP Location: Right Arm)   Pulse 66   Ht 6\' 3"  (1.905 m)   Wt 142 lb 12.8 oz (64.8 kg)   SpO2 98%   BMI 17.85 kg/m     Wt Readings from Last 3 Encounters:  07/03/23 142 lb 12.8 oz (64.8 kg)  05/22/19 159 lb (72.1 kg) (59%, Z= 0.22)*  10/11/15 129 lb (58.5 kg) (46%, Z= -0.09)*   * Growth percentiles are based on CDC (Boys, 2-20 Years) data.     GEN: Well nourished, well developed in no acute distress HEENT: Normal NECK: No JVD; No carotid bruits LYMPHATICS: No lymphadenopathy CARDIAC: S1S2 noted,RRR, no murmurs, rubs, gallops RESPIRATORY:  Clear to auscultation without rales, wheezing or rhonchi  ABDOMEN: Soft, non-tender, non-distended, +bowel sounds, no guarding. EXTREMITIES: No edema, No cyanosis, no clubbing MUSCULOSKELETAL:  No deformity  SKIN: Warm and dry NEUROLOGIC:  Alert and oriented x 3, non-focal PSYCHIATRIC:  Normal affect, good insight  ASSESSMENT:    1. Screening for heart disease   2.  Encounter to establish care   3. Fatigue, unspecified type   4. Atypical chest pain   5. Palpitations    PLAN:     Chest Pain Recurrent episodes of chest pain, particularly after eating fatty meals. No history of heart disease but family history of heart problems.  -Order thyroid function tests to rule out thyroid-related issues. -Order 14-day heart monitor to assess for arrhythmias. Will benefit from echo to rule out any structural abnormalities.  Hypotension Chronic low blood pressure, particularly after eating. Episodes of lightheadedness and shortness of breath, previously diagnosed as exercise-induced asthma. -Advise to increase fluid and salt intake to help maintain blood pressure. -Consider treatment for low blood pressure if symptoms persist after other investigations.  Smoking Current smoker. -Advise to quit smoking for overall health improvement.  Follow-up Pending results of thyroid function tests and heart monitor.  The patient is in agreement with the above plan. The patient left the office in stable condition.  The patient will follow up in   Medication Adjustments/Labs and Tests Ordered: Current medicines are reviewed at length with the patient today.  Concerns regarding medicines are outlined above.  Orders Placed  This Encounter  Procedures   TSH+T4F+T3Free   LONG TERM MONITOR (3-14 DAYS)   EKG 12-Lead   ECHOCARDIOGRAM COMPLETE   No orders of the defined types were placed in this encounter.   Patient Instructions  Medication Instructions:  Your physician recommends that you continue on your current medications as directed. Please refer to the Current Medication list given to you today.  *If you need a refill on your cardiac medications before your next appointment, please call your pharmacy*   Lab Work: TSH If you have labs (blood work) drawn today and your tests are completely normal, you will receive your results only by: MyChart Message (if you  have MyChart) OR A paper copy in the mail If you have any lab test that is abnormal or we need to change your treatment, we will call you to review the results.   Testing/Procedures: Your physician has requested that you have an echocardiogram -[place]. Echocardiography is a painless test that uses sound waves to create images of your heart. It provides your doctor with information about the size and shape of your heart and how well your heart's chambers and valves are working. This procedure takes approximately one hour. There are no restrictions for this procedure. Please do NOT wear cologne, perfume, aftershave, or lotions (deodorant is allowed). Please arrive 15 minutes prior to your appointment time.  Please note: We ask at that you not bring children with you during ultrasound (echo/ vascular) testing. Due to room size and safety concerns, children are not allowed in the ultrasound rooms during exams. Our front office staff cannot provide observation of children in our lobby area while testing is being conducted. An adult accompanying a patient to their appointment will only be allowed in the ultrasound room at the discretion of the ultrasound technician under special circumstances. We apologize for any inconvenience.  ZIO XT- Long Term Monitor Instructions  Your physician has requested you wear a ZIO patch monitor for 14 days.  This is a single patch monitor. [employer] supplies one patch monitor per enrollment. Additional stickers are not available. Please do not apply patch if you will be having a Nuclear Stress Test,  Echocardiogram, Cardiac CT, MRI, or Chest Xray during the period you would be wearing the  monitor. The patch cannot be worn during these tests. You cannot remove and re-apply the  ZIO XT patch monitor.  Your ZIO patch monitor will be mailed 3 day USPS to your address on file. It may take 3-5 days  to receive your monitor after you have been enrolled.  Once you have  received your monitor, please review the enclosed instructions. Your monitor  has already been registered assigning a specific monitor serial # to you.  Billing and Patient Assistance Program Information  We have supplied [employer] with any of your insurance information on file for billing purposes. [employer] offers a sliding scale Patient Assistance Program for patients that do not have  insurance, or whose insurance does not completely cover the cost of the ZIO monitor.  You must apply for the Patient Assistance Program to qualify for this discounted rate.  To apply, please call [employer] at [phone], select option 4, select option 2, ask to apply for  Patient Assistance Program. Meredeth Ide will ask your household income, and how many people  are in your household. They will quote your out-of-pocket cost based on that information.  [employer] will also be able to set up a 55-month, interest-free payment plan if needed.  Applying  the monitor   Shave hair from upper left chest.  Hold abrader disc by orange tab. Rub abrader in 40 strokes over the upper left chest as  indicated in your monitor instructions.  Clean area with 4 enclosed alcohol pads. Let dry.  Apply patch as indicated in monitor instructions. Patch will be placed under collarbone on left  side of chest with arrow pointing upward.  Rub patch adhesive wings for 2 minutes. Remove white label marked "1". Remove the white  label marked "2". Rub patch adhesive wings for 2 additional minutes.  While looking in a mirror, press and release button in center of patch. A small green light will  flash 3-4 times. This will be your only indicator that the monitor has been turned on.  Do not shower for the first 24 hours. You may shower after the first 24 hours.  Press the button if you feel a symptom. You will hear a small click. Record Date, Time and  Symptom in the Patient Logbook.  When you are ready to remove the patch, follow instructions on  the last 2 pages of Patient  Logbook. Stick patch monitor onto the last page of Patient Logbook.  Place Patient Logbook in the blue and white box. Use locking tab on box and tape box closed  securely. The blue and white box has prepaid postage on it. Please place it in the mailbox as  soon as possible. Your physician should have your test results approximately 7 days after the  monitor has been mailed back to [facility].  Call [facility] Customer Care at [phone] if you have questions regarding  your ZIO XT patch monitor. Call them immediately if you see an orange light blinking on your  monitor.  If your monitor falls off in less than 4 days, contact our Monitor department at [phone].  If your monitor becomes loose or falls off after 4 days call [employer] at [phone] for  suggestions on securing your monitor    Follow-Up: At [facility], you and your health needs are our priority.  As part of our continuing mission to provide you with exceptional heart care, we have created designated Provider Care Teams.  These Care Teams include your primary Cardiologist (physician) and Advanced Practice Providers (APPs -  Physician Assistants and Nurse Practitioners) who all work together to provide you with the care you need, when you need it.   Your next appointment:   15 week(s)  Provider:   Thomasene Ripple, DO     Adopting a Healthy Lifestyle.  Know what a healthy weight is for you (roughly BMI <25) and aim to maintain this   Aim for 7+ servings of fruits and vegetables daily   65-80+ fluid ounces of water or unsweet tea for healthy kidneys   Limit to max 1 drink of alcohol per day; avoid smoking/tobacco   Limit animal fats in diet for cholesterol and heart health - choose grass fed whenever available   Avoid highly processed foods, and foods high in saturated/trans fats   Aim for low stress - take time to unwind and care for your mental health   Aim for  150 min of moderate intensity exercise weekly for heart health, and weights twice weekly for bone health   Aim for 7-9 hours of sleep daily   When it comes to diets, agreement about the perfect plan isnt easy to find, even among the experts. Experts at the [employer] developed an  idea known as the Healthy Eating Plate. Just imagine a plate divided into logical, healthy portions.   The emphasis is on diet quality:   Load up on vegetables and fruits - one-half of your plate: Aim for color and variety, and remember that potatoes dont count.   Go for whole grains - one-quarter of your plate: Whole wheat, barley, wheat berries, quinoa, oats, brown rice, and foods made with them. If you want pasta, go with whole wheat pasta.   Protein power - one-quarter of your plate: Fish, chicken, beans, and nuts are all healthy, versatile protein sources. Limit red meat.   The diet, however, does go beyond the plate, offering a few other suggestions.   Use healthy plant oils, such as olive, canola, soy, corn, sunflower and peanut. Check the labels, and avoid partially hydrogenated oil, which have unhealthy trans fats.   If youre thirsty, drink water. Coffee and tea are good in moderation, but skip sugary drinks and limit milk and dairy products to one or two daily servings.   The type of carbohydrate in the diet is more important than the amount. Some sources of carbohydrates, such as vegetables, fruits, whole grains, and beans-are healthier than others.   Finally, stay active  Signed, Christopher Ripple, DO  07/06/2023 8:05 PM    [facility]

## 2023-07-03 NOTE — Progress Notes (Unsigned)
Enrolled for [employer] to mail a ZIO XT long term holter monitor to [address] [place], [place]  [address]

## 2023-07-04 LAB — TSH+T4F+T3FREE
Free T4: 1.3 ng/dL (ref 0.82–1.77)
T3, Free: 2.9 pg/mL (ref 2.0–4.4)
TSH: 2.3 u[IU]/mL (ref 0.450–4.500)

## 2023-07-06 DIAGNOSIS — R002 Palpitations: Secondary | ICD-10-CM | POA: Diagnosis not present

## 2023-07-26 ENCOUNTER — Ambulatory Visit: Payer: Medicaid Other | Attending: Cardiology

## 2023-07-26 DIAGNOSIS — R0789 Other chest pain: Secondary | ICD-10-CM

## 2023-07-26 LAB — ECHOCARDIOGRAM COMPLETE: S' Lateral: 2.9 cm

## 2023-08-13 ENCOUNTER — Encounter: Payer: Self-pay | Admitting: Cardiology

## 2023-08-16 NOTE — Telephone Encounter (Signed)
 Appt made 1/8

## 2023-08-22 ENCOUNTER — Ambulatory Visit: Payer: Medicaid Other | Attending: Cardiology | Admitting: Cardiology

## 2023-08-22 ENCOUNTER — Encounter: Payer: Self-pay | Admitting: Cardiology

## 2023-08-22 VITALS — Ht 75.0 in | Wt 140.0 lb

## 2023-08-22 DIAGNOSIS — R42 Dizziness and giddiness: Secondary | ICD-10-CM | POA: Diagnosis not present

## 2023-08-22 DIAGNOSIS — R55 Syncope and collapse: Secondary | ICD-10-CM | POA: Diagnosis not present

## 2023-08-22 DIAGNOSIS — R0789 Other chest pain: Secondary | ICD-10-CM | POA: Diagnosis not present

## 2023-08-22 NOTE — Addendum Note (Signed)
 Addended by: Benay Spice on: 08/22/2023 09:29 AM   Modules accepted: Orders

## 2023-08-22 NOTE — Progress Notes (Signed)
 Virtual Visit via Video Note   Because of Christopher Koch's co-morbid illnesses, he is at least at moderate risk for complications without adequate follow up.  This format is felt to be most appropriate for this patient at this time.  All issues noted in this document were discussed and addressed.  A limited physical exam was performed with this format.  Please refer to the patient's chart for his consent to telehealth for [facility].      Date:  08/22/2023   ID:  Christopher Koch, DOB 13-Sep-2000, MRN [id number]  Patient Location: Home Provider Location: Office/Clinic  PCP:  Arloa Suzen RAMAN, NP  Cardiologist:  Dub Huntsman, DO  Electrophysiologist:  None   Evaluation Performed:    Chief Complaint: Shortness of breath and presyncope  History of Present Illness:    Christopher Koch is a 23 y.o. male with history of intermittent chest pain and noted random shortness of breath in past.  In experience some syncope/presyncope episode after having high fatty food.  He is here today to discuss his monitor result as well as his echo result.  The patient does not have symptoms concerning for COVID-19 infection (fever, chills, cough, or new shortness of breath).    Past Medical History:  Diagnosis Date   ADD (attention deficit disorder)    Past Surgical History:  Procedure Laterality Date   NO PAST SURGERIES       No outpatient medications have been marked as taking for the 08/22/23 encounter (Video Visit) with Eaven Schwager, DO.     Allergies:   Patient has no known allergies.   Social History   Tobacco Use   Smoking status: Never   Smokeless tobacco: Never  Vaping Use   Vaping status: Never Used  Substance Use Topics   Alcohol use: Never    Alcohol/week: 0.0 standard drinks of alcohol   Drug use: Never     Family Hx: The patient's family history includes Diabetes in his maternal grandfather; Heart disease in his maternal grandmother.  ROS:   Please see the  history of present illness.    Chest pain, presyncope shortness of breath All other systems reviewed and are negative.   Prior CV studies:   The following studies were reviewed today:  Review echo and ZIO monitor  Labs/Other Tests and Data Reviewed:    EKG:  No ECG reviewed.  Recent Labs: 07/03/2023: TSH 2.300   Recent Lipid Panel No results found for: CHOL, TRIG, HDL, CHOLHDL, LDLCALC, LDLDIRECT  Wt Readings from Last 3 Encounters:  08/22/23 140 lb (63.5 kg)  07/03/23 142 lb 12.8 oz (64.8 kg)  05/22/19 159 lb (72.1 kg) (59%, Z= 0.22)*   * Growth percentiles are based on CDC (Boys, 2-20 Years) data.     Objective:    Vital Signs:  Ht 6' 3 (1.905 m)   Wt 140 lb (63.5 kg)   BMI 17.50 kg/m      ASSESSMENT & PLAN:    Presyncope Atypical chest pain Abnormal echocardiogram   We discussed his monitor results which was normal.  His echocardiogram did show evidence of false cord in the mid cavity the LV.  His symptoms are atypical chest pain, rtion and after he has had a high-fat meal.  I would like to understand if he is experiencing any LVOT gradient.  Will have the patient get a stress echo for this.  COVID-19 Education: The signs and symptoms of COVID-19 were discussed with the patient  and how to seek care for testing (follow up with PCP or arrange E-visit).  The importance of social distancing was discussed today.  Time:   Today, I have spent 15 minutes with the patient with telehealth technology discussing the above problems.     Medication Adjustments/Labs and Tests Ordered: Current medicines are reviewed at length with the patient today.  Concerns regarding medicines are outlined above.   Tests Ordered: No orders of the defined types were placed in this encounter.   Medication Changes: No orders of the defined types were placed in this encounter.   Follow Up:  In Person in 16 week(s)  Signed, Sarrinah Gardin, DO  08/22/2023 9:03 AM    [facility]

## 2023-08-22 NOTE — Patient Instructions (Signed)
 Medication Instructions:  Your physician recommends that you continue on your current medications as directed. Please refer to the Current Medication list given to you today.  *If you need a refill on your cardiac medications before your next appointment, please call your pharmacy*   Testing/Procedures: Your physician has requested that you have a Exercise Stress Echocardiogram. Please follow instruction sheet as given.      Stress Echocardiogram Information Sheet                                                      Instructions:    1. You may take your morning medications the morning of the test.  2. Light breakfast no caffeine.  3. Dress prepared to exercise.  4. DO NOT use ANY caffeine or tobacco products 3 hours before appointment.  5. Please bring all current prescription medications.   Please note: We ask at that you not bring children with you during ultrasound (echo/ vascular) testing. Due to room size and safety concerns, children are not allowed in the ultrasound rooms during exams. Our front office staff cannot provide observation of children in our lobby area while testing is being conducted. An adult accompanying a patient to their appointment will only be allowed in the ultrasound room at the discretion of the ultrasound technician under special circumstances. We apologize for any inconvenience.    Follow-Up: At [facility], you and your health needs are our priority.  As part of our continuing mission to provide you with exceptional heart care, we have created designated Provider Care Teams.  These Care Teams include your primary Cardiologist (physician) and Advanced Practice Providers (APPs -  Physician Assistants and Nurse Practitioners) who all work together to provide you with the care you need, when you need it.  We recommend signing up for the patient portal called MyChart.  Sign up information is provided on this After Visit Summary.  MyChart is used to  connect with patients for Virtual Visits (Telemedicine).  Patients are able to view lab/test results, encounter notes, upcoming appointments, etc.  Non-urgent messages can be sent to your provider as well.   To learn more about what you can do with MyChart, go to [email or web address].    Your next appointment:   16 weeks  Provider:   Kardie Tobb, DO

## 2023-08-29 NOTE — Addendum Note (Signed)
 Addended by: SHEENA PUGH on: 08/29/2023 07:21 PM   Modules accepted: Orders

## 2023-09-08 ENCOUNTER — Ambulatory Visit (HOSPITAL_BASED_OUTPATIENT_CLINIC_OR_DEPARTMENT_OTHER)
Admission: EM | Admit: 2023-09-08 | Discharge: 2023-09-08 | Disposition: A | Discharge status: Home / Self Care | Payer: Medicaid Other | Attending: Family Medicine | Admitting: Family Medicine

## 2023-09-08 ENCOUNTER — Encounter (HOSPITAL_BASED_OUTPATIENT_CLINIC_OR_DEPARTMENT_OTHER): Payer: Self-pay | Admitting: Emergency Medicine

## 2023-09-08 DIAGNOSIS — H66002 Acute suppurative otitis media without spontaneous rupture of ear drum, left ear: Secondary | ICD-10-CM

## 2023-09-08 DIAGNOSIS — J208 Acute bronchitis due to other specified organisms: Secondary | ICD-10-CM | POA: Diagnosis not present

## 2023-09-08 MED ORDER — COMPACT SPACE CHAMBER DEVI
0 refills | Status: AC
Start: 2023-09-08 — End: ?

## 2023-09-08 MED ORDER — AMOXICILLIN 875 MG PO TABS: 875.0000 mg | ORAL_TABLET | Freq: Two times a day (BID) | ORAL | 0 refills | Status: AC

## 2023-09-08 MED ORDER — ALBUTEROL SULFATE HFA 108 (90 BASE) MCG/ACT IN AERS: 2.0000 | INHALATION_SPRAY | RESPIRATORY_TRACT | 0 refills | Status: AC | PRN

## 2023-09-08 MED ORDER — BENZONATATE 200 MG PO CAPS
200.0000 mg | ORAL_CAPSULE | Freq: Three times a day (TID) | ORAL | 0 refills | Status: AC | PRN
Start: 2023-09-08 — End: ?

## 2023-09-08 NOTE — Discharge Instructions (Addendum)
Exam shows left ear infection.  Will treat with amoxicillin, 875 mg, twice daily for 7 days.  Get plenty of fluids and rest.  Early bronchitis on exam.  Albuterol inhaler with spacer, 2 puffs, every 4 hours, as needed for wheezing.  Benzonatate, 200 mg, 1 every 8 hours, as needed for cough.  Encouraged to use over-the-counter Mucinex, as directed on the package, to help make mucus or secretions thinner and easier to cough out.  Follow-up if symptoms do not improve, worsen or new symptoms occur.  Provided a work excuse.

## 2023-09-08 NOTE — ED Triage Notes (Signed)
Pt reports he has cold like symptoms x 1 week. Pt c/o nasal congestion, cough, headache, sore throat, body aches.

## 2023-09-08 NOTE — ED Provider Notes (Signed)
[facility]    CSN: [id number] Arrival date & time: 09/08/23  1191      History   Chief Complaint Chief Complaint  Patient presents with   Cough   Nasal Congestion    HPI Christopher Koch is a 23 y.o. male.   Here with complaint of cough and congestion for over a week.  He thinks he may have had a fever during the night and he is taken Tylenol.  He did not have a thermometer.  He is now having some intermittent wheezing.  Denies asthma but reports he was told at one point that he had exercise-induced bronchospasms.  He is having ear pain and he just generally does not feel good.   Cough Associated symptoms: fever, rhinorrhea and wheezing   Associated symptoms: no chest pain, no chills, no ear pain, no rash, no shortness of breath and no sore throat     Past Medical History:  Diagnosis Date   ADD (attention deficit disorder)     Patient Active Problem List   Diagnosis Date Noted   Cerumen impaction 05/25/2019   Exercise-induced asthma 05/22/2019   Foot pain, bilateral 05/22/2019   Allergic rhinitis 05/22/2019   GERD (gastroesophageal reflux disease) 05/22/2019    Past Surgical History:  Procedure Laterality Date   NO PAST SURGERIES         Home Medications    Prior to Admission medications   Medication Sig Start Date End Date Taking? Authorizing Provider  albuterol (VENTOLIN HFA) 108 (90 Base) MCG/ACT inhaler Inhale 2 puffs into the lungs every 4 (four) hours as needed for wheezing or shortness of breath. 09/08/23  Yes Prescilla Sours, FNP  amoxicillin (AMOXIL) 875 MG tablet Take 1 tablet (875 mg total) by mouth 2 (two) times daily for 7 days. 09/08/23 09/15/23 Yes Prescilla Sours, FNP  benzonatate (TESSALON) 200 MG capsule Take 1 capsule (200 mg total) by mouth 3 (three) times daily as needed for cough. 09/08/23  Yes Prescilla Sours, FNP  Spacer/Aero-Holding Chambers (COMPACT SPACE CHAMBER) DEVI Use with the albuterol inhaler 09/08/23  Yes Prescilla Sours, FNP   Cetirizine HCl 10 MG CAPS Take 1 capsule (10 mg total) by mouth daily for 10 days. 05/14/20 05/24/20  Wieters, Junius Creamer, PA-C    Family History Family History  Problem Relation Age of Onset   Heart disease Maternal Grandmother    Diabetes Maternal Grandfather     Social History Social History   Tobacco Use   Smoking status: Never   Smokeless tobacco: Never  Vaping Use   Vaping status: Never Used  Substance Use Topics   Alcohol use: Never    Alcohol/week: 0.0 standard drinks of alcohol   Drug use: Never     Allergies   Patient has no known allergies.   Review of Systems Review of Systems  Constitutional:  Positive for fever. Negative for chills.  HENT:  Positive for congestion, postnasal drip and rhinorrhea. Negative for ear pain and sore throat.   Eyes:  Negative for pain and visual disturbance.  Respiratory:  Positive for cough and wheezing. Negative for shortness of breath.   Cardiovascular:  Negative for chest pain and palpitations.  Gastrointestinal:  Negative for abdominal pain, constipation, diarrhea, nausea and vomiting.  Genitourinary:  Negative for dysuria and hematuria.  Musculoskeletal:  Positive for arthralgias. Negative for back pain.  Skin:  Negative for color change and rash.  Neurological:  Negative for seizures and syncope.  All other systems reviewed and are  negative.    Physical Exam Triage Vital Signs ED Triage Vitals  Encounter Vitals Group     BP 09/08/23 0852 118/73     Systolic BP Percentile --      Diastolic BP Percentile --      Pulse Rate 09/08/23 0852 77     Resp 09/08/23 0852 18     Temp 09/08/23 0852 98.3 F (36.8 C)     Temp Source 09/08/23 0852 Oral     SpO2 09/08/23 0852 96 %     Weight --      Height --      Head Circumference --      Peak Flow --      Pain Score 09/08/23 0851 0     Pain Loc --      Pain Education --      Exclude from Growth Chart --    No data found.  Updated Vital Signs BP 118/73 (BP Location:  Right Arm)   Pulse 77   Temp 98.3 F (36.8 C) (Oral)   Resp 18   SpO2 96%   Visual Acuity Right Eye Distance:   Left Eye Distance:   Bilateral Distance:    Right Eye Near:   Left Eye Near:    Bilateral Near:     Physical Exam Vitals and nursing note reviewed.  Constitutional:      General: He is not in acute distress.    Appearance: He is well-developed. He is not ill-appearing or toxic-appearing.  HENT:     Head: Normocephalic and atraumatic.     Right Ear: Hearing, tympanic membrane, ear canal and external ear normal.     Left Ear: Ear canal and external ear normal. A middle ear effusion is present. Tympanic membrane is erythematous.     Nose: Congestion and rhinorrhea present. Rhinorrhea is clear.     Right Sinus: No maxillary sinus tenderness or frontal sinus tenderness.     Left Sinus: No maxillary sinus tenderness or frontal sinus tenderness.     Mouth/Throat:     Lips: Pink.     Mouth: Mucous membranes are moist.     Pharynx: Uvula midline. No oropharyngeal exudate or posterior oropharyngeal erythema.     Tonsils: No tonsillar exudate.  Eyes:     Conjunctiva/sclera: Conjunctivae normal.     Pupils: Pupils are equal, round, and reactive to light.  Cardiovascular:     Rate and Rhythm: Normal rate and regular rhythm.     Heart sounds: S1 normal and S2 normal. No murmur heard. Pulmonary:     Effort: Pulmonary effort is normal. No respiratory distress.     Breath sounds: Examination of the right-upper field reveals wheezing. Examination of the left-upper field reveals wheezing. Wheezing (Mild) present. No decreased breath sounds, rhonchi or rales.  Abdominal:     Palpations: Abdomen is soft.     Tenderness: There is no abdominal tenderness.  Musculoskeletal:        General: No swelling.     Cervical back: Neck supple.  Lymphadenopathy:     Head:     Right side of head: No submental, submandibular, tonsillar, preauricular or posterior auricular adenopathy.     Left  side of head: Preauricular and posterior auricular adenopathy present. No submental, submandibular or tonsillar adenopathy.     Cervical: No cervical adenopathy.     Right cervical: No superficial cervical adenopathy.    Left cervical: No superficial cervical adenopathy.  Skin:    General: Skin  is warm and dry.     Capillary Refill: Capillary refill takes less than 2 seconds.     Findings: No rash.  Neurological:     Mental Status: He is alert and oriented to person, place, and time.  Psychiatric:        Mood and Affect: Mood normal.      UC Treatments / Results  Labs (all labs ordered are listed, but only abnormal results are displayed) Labs Reviewed - No data to display  EKG   Radiology No results found.  Procedures Procedures (including critical care time)  Medications Ordered in UC Medications - No data to display  Initial Impression / Assessment and Plan / UC Course  I have reviewed the triage vital signs and the nursing notes.  Pertinent labs & imaging results that were available during my care of the patient were reviewed by me and considered in my medical decision making (see chart for details).  Exam shows left otitis media, Amoxil 875, 1 twice daily for 7 days.  Get plenty of fluids and rest.  Exam also shows acute bronchitis.  Will treat with albuterol and spacer, 2 puffs, every 4 hours, as needed for wheezing.  Benzonatate, 200 mg, 1 every 8 hours, as needed for cough.  Recheck if symptoms do not improve, worsen or new symptoms occur.  Provided a work excuse. Final Clinical Impressions(s) / UC Diagnoses   Final diagnoses:  Acute viral bronchitis  Non-recurrent acute suppurative otitis media of left ear without spontaneous rupture of tympanic membrane     Discharge Instructions      Exam shows left ear infection.  Will treat with amoxicillin, 875 mg, twice daily for 7 days.  Get plenty of fluids and rest.  Early bronchitis on exam.  Albuterol inhaler  with spacer, 2 puffs, every 4 hours, as needed for wheezing.  Benzonatate, 200 mg, 1 every 8 hours, as needed for cough.  Encouraged to use over-the-counter Mucinex, as directed on the package, to help make mucus or secretions thinner and easier to cough out.  Follow-up if symptoms do not improve, worsen or new symptoms occur.  Provided a work excuse.     ED Prescriptions     Medication Sig Dispense Auth. Provider   benzonatate (TESSALON) 200 MG capsule Take 1 capsule (200 mg total) by mouth 3 (three) times daily as needed for cough. 20 capsule Prescilla Sours, FNP   amoxicillin (AMOXIL) 875 MG tablet Take 1 tablet (875 mg total) by mouth 2 (two) times daily for 7 days. 14 tablet Prescilla Sours, FNP   albuterol (VENTOLIN HFA) 108 (90 Base) MCG/ACT inhaler Inhale 2 puffs into the lungs every 4 (four) hours as needed for wheezing or shortness of breath. 1 each Prescilla Sours, FNP   Spacer/Aero-Holding Chambers (COMPACT SPACE CHAMBER) DEVI Use with the albuterol inhaler 1 each Prescilla Sours, FNP      PDMP not reviewed this encounter.   Prescilla Sours, FNP 09/08/23 [phone]

## 2023-09-18 NOTE — Progress Notes (Signed)
 This encounter was created in error - please disregard.

## 2023-09-27 ENCOUNTER — Encounter (HOSPITAL_COMMUNITY): Payer: Self-pay

## 2023-10-05 ENCOUNTER — Ambulatory Visit (HOSPITAL_COMMUNITY): Payer: Medicaid Other | Attending: Cardiology

## 2023-10-05 ENCOUNTER — Ambulatory Visit (HOSPITAL_BASED_OUTPATIENT_CLINIC_OR_DEPARTMENT_OTHER)
Admission: RE | Admit: 2023-10-05 | Discharge: 2023-10-05 | Disposition: A | Discharge status: Home / Self Care | Payer: Medicaid Other | Source: Ambulatory Visit | Attending: Cardiology | Admitting: Cardiology

## 2023-10-05 DIAGNOSIS — R42 Dizziness and giddiness: Secondary | ICD-10-CM | POA: Diagnosis present

## 2023-10-05 DIAGNOSIS — R55 Syncope and collapse: Secondary | ICD-10-CM | POA: Insufficient documentation

## 2023-10-05 MED ORDER — PERFLUTREN LIPID MICROSPHERE
1.0000 mL | INTRAVENOUS | Status: AC | PRN
  Administered 2023-10-05: 2 mL via INTRAVENOUS
  Administered 2023-10-05: 3 mL via INTRAVENOUS
  Administered 2023-10-05 (×2): 2 mL via INTRAVENOUS

## 2023-10-06 LAB — ECHOCARDIOGRAM STRESS TEST
Area-P 1/2: 3.54 cm2
S' Lateral: 2.6 cm

## 2023-10-16 ENCOUNTER — Ambulatory Visit: Payer: Medicaid Other | Attending: Cardiology | Admitting: Cardiology

## 2023-10-17 ENCOUNTER — Encounter: Payer: Self-pay | Admitting: Cardiology
